# Patient Record
Sex: Female | Born: 1950 | Hispanic: No | Marital: Single | Smoking: Former smoker
Health system: Southern US, Community
[De-identification: ages and names within clinical notes are randomized; demographics above are authoritative.]

## PROBLEM LIST (undated history)

## (undated) DIAGNOSIS — E119 Type 2 diabetes mellitus without complications: Secondary | ICD-10-CM

## (undated) DIAGNOSIS — K219 Gastro-esophageal reflux disease without esophagitis: Secondary | ICD-10-CM

## (undated) DIAGNOSIS — J449 Chronic obstructive pulmonary disease, unspecified: Secondary | ICD-10-CM

## (undated) DIAGNOSIS — G709 Myoneural disorder, unspecified: Secondary | ICD-10-CM

## (undated) DIAGNOSIS — Z8701 Personal history of pneumonia (recurrent): Secondary | ICD-10-CM

## (undated) DIAGNOSIS — G4730 Sleep apnea, unspecified: Secondary | ICD-10-CM

## (undated) DIAGNOSIS — F32A Depression, unspecified: Secondary | ICD-10-CM

## (undated) DIAGNOSIS — E785 Hyperlipidemia, unspecified: Secondary | ICD-10-CM

## (undated) DIAGNOSIS — I10 Essential (primary) hypertension: Secondary | ICD-10-CM

## (undated) DIAGNOSIS — F329 Major depressive disorder, single episode, unspecified: Secondary | ICD-10-CM

## (undated) DIAGNOSIS — M1990 Unspecified osteoarthritis, unspecified site: Secondary | ICD-10-CM

## (undated) DIAGNOSIS — G510 Bell's palsy: Secondary | ICD-10-CM

## (undated) HISTORY — PX: ABDOMINAL HYSTERECTOMY: SHX81

## (undated) HISTORY — PX: COLONOSCOPY: SHX174

## (undated) HISTORY — PX: APPENDECTOMY: SHX54

## (undated) HISTORY — PX: ANKLE SURGERY: SHX546

---

## 1986-03-02 HISTORY — PX: LUMBAR LAMINECTOMY: SHX95

## 1990-03-02 HISTORY — PX: LUMBAR FUSION: SHX111

## 2010-08-25 ENCOUNTER — Other Ambulatory Visit: Payer: Self-pay | Admitting: Physical Medicine and Rehabilitation

## 2010-08-25 DIAGNOSIS — M79605 Pain in left leg: Secondary | ICD-10-CM

## 2010-08-25 DIAGNOSIS — M79604 Pain in right leg: Secondary | ICD-10-CM

## 2010-08-26 ENCOUNTER — Ambulatory Visit
Admission: RE | Admit: 2010-08-26 | Discharge: 2010-08-26 | Disposition: A | Discharge status: Home / Self Care | Payer: Medicare Other | Source: Ambulatory Visit | Attending: Physical Medicine and Rehabilitation | Admitting: Physical Medicine and Rehabilitation

## 2010-08-26 DIAGNOSIS — M79604 Pain in right leg: Secondary | ICD-10-CM

## 2010-12-11 DIAGNOSIS — G4733 Obstructive sleep apnea (adult) (pediatric): Secondary | ICD-10-CM | POA: Insufficient documentation

## 2010-12-11 DIAGNOSIS — J449 Chronic obstructive pulmonary disease, unspecified: Secondary | ICD-10-CM | POA: Insufficient documentation

## 2011-04-15 DIAGNOSIS — M1990 Unspecified osteoarthritis, unspecified site: Secondary | ICD-10-CM | POA: Insufficient documentation

## 2012-12-19 DIAGNOSIS — Z9981 Dependence on supplemental oxygen: Secondary | ICD-10-CM | POA: Insufficient documentation

## 2013-02-08 DIAGNOSIS — G90529 Complex regional pain syndrome I of unspecified lower limb: Secondary | ICD-10-CM | POA: Insufficient documentation

## 2013-10-03 ENCOUNTER — Other Ambulatory Visit: Payer: Self-pay | Admitting: Physical Medicine and Rehabilitation

## 2013-10-03 DIAGNOSIS — M545 Low back pain: Secondary | ICD-10-CM

## 2013-10-31 ENCOUNTER — Other Ambulatory Visit: Payer: Medicare Other

## 2013-11-14 ENCOUNTER — Ambulatory Visit
Admission: RE | Admit: 2013-11-14 | Discharge: 2013-11-14 | Disposition: A | Discharge status: Home / Self Care | Payer: Medicare Other | Source: Ambulatory Visit | Attending: Physical Medicine and Rehabilitation | Admitting: Physical Medicine and Rehabilitation

## 2013-11-14 DIAGNOSIS — M545 Low back pain: Secondary | ICD-10-CM

## 2013-11-14 MED ORDER — GADOBENATE DIMEGLUMINE 529 MG/ML IV SOLN
20.0000 mL | Freq: Once | INTRAVENOUS | Status: AC | PRN
  Administered 2013-11-14: 20 mL via INTRAVENOUS

## 2014-10-19 DIAGNOSIS — N611 Abscess of the breast and nipple: Secondary | ICD-10-CM | POA: Insufficient documentation

## 2015-03-19 ENCOUNTER — Other Ambulatory Visit: Payer: Self-pay | Admitting: Physical Medicine and Rehabilitation

## 2015-03-19 DIAGNOSIS — M5412 Radiculopathy, cervical region: Secondary | ICD-10-CM

## 2015-03-21 DIAGNOSIS — F321 Major depressive disorder, single episode, moderate: Secondary | ICD-10-CM | POA: Insufficient documentation

## 2015-03-24 ENCOUNTER — Ambulatory Visit
Admission: RE | Admit: 2015-03-24 | Discharge: 2015-03-24 | Disposition: A | Discharge status: Home / Self Care | Payer: Medicare Other | Source: Ambulatory Visit | Attending: Physical Medicine and Rehabilitation | Admitting: Physical Medicine and Rehabilitation

## 2015-03-24 DIAGNOSIS — M5412 Radiculopathy, cervical region: Secondary | ICD-10-CM

## 2015-04-12 ENCOUNTER — Other Ambulatory Visit: Payer: Self-pay | Admitting: Neurosurgery

## 2015-04-12 DIAGNOSIS — G8929 Other chronic pain: Secondary | ICD-10-CM

## 2015-04-12 DIAGNOSIS — M545 Low back pain, unspecified: Secondary | ICD-10-CM

## 2015-04-12 DIAGNOSIS — M542 Cervicalgia: Secondary | ICD-10-CM

## 2015-05-01 HISTORY — PX: CARPAL TUNNEL RELEASE: SHX101

## 2015-05-03 ENCOUNTER — Ambulatory Visit
Admission: RE | Admit: 2015-05-03 | Discharge: 2015-05-03 | Disposition: A | Discharge status: Home / Self Care | Payer: Medicare Other | Source: Ambulatory Visit | Attending: Neurosurgery | Admitting: Neurosurgery

## 2015-05-03 ENCOUNTER — Encounter: Payer: Self-pay | Admitting: Radiology

## 2015-05-03 DIAGNOSIS — M542 Cervicalgia: Secondary | ICD-10-CM

## 2015-05-03 DIAGNOSIS — G8929 Other chronic pain: Secondary | ICD-10-CM

## 2015-05-03 DIAGNOSIS — E785 Hyperlipidemia, unspecified: Secondary | ICD-10-CM | POA: Insufficient documentation

## 2015-05-03 DIAGNOSIS — M545 Low back pain, unspecified: Secondary | ICD-10-CM

## 2015-05-03 DIAGNOSIS — I10 Essential (primary) hypertension: Secondary | ICD-10-CM | POA: Insufficient documentation

## 2015-05-03 MED ORDER — DIAZEPAM 5 MG PO TABS
10.0000 mg | ORAL_TABLET | Freq: Once | ORAL | Status: AC
  Administered 2015-05-03: 10 mg via ORAL

## 2015-05-03 MED ORDER — MEPERIDINE HCL 100 MG/ML IJ SOLN
100.0000 mg | Freq: Once | INTRAMUSCULAR | Status: AC
  Administered 2015-05-03: 100 mg via INTRAMUSCULAR

## 2015-05-03 MED ORDER — IOHEXOL 300 MG/ML  SOLN
10.0000 mL | Freq: Once | INTRAMUSCULAR | Status: AC | PRN
  Administered 2015-05-03: 10 mL via INTRATHECAL

## 2015-05-03 MED ORDER — ONDANSETRON HCL 4 MG/2ML IJ SOLN
4.0000 mg | Freq: Once | INTRAMUSCULAR | Status: AC
  Administered 2015-05-03: 4 mg via INTRAMUSCULAR

## 2015-05-03 NOTE — Progress Notes (Signed)
Pt states she has been off Pamelor, Phentermine and Cymbalta for the past 2 days.  Discharge instructions explained to pt.

## 2015-05-03 NOTE — Discharge Instructions (Signed)
Myelogram Discharge Instructions  1. Go home and rest quietly for the next 24 hours.  It is important to lie flat for the next 24 hours.  Get up only to go to the restroom.  You may lie in the bed or on a couch on your back, your stomach, your left side or your right side.  You may have one pillow under your head.  You may have pillows between your knees while you are on your side or under your knees while you are on your back.  2. DO NOT drive today.  Recline the seat as far back as it will go, while still wearing your seat belt, on the way home.  3. You may get up to go to the bathroom as needed.  You may sit up for 10 minutes to eat.  You may resume your normal diet and medications unless otherwise indicated.  Drink lots of extra fluids today and tomorrow.  4. The incidence of headache, nausea, or vomiting is about 5% (one in 20 patients).  If you develop a headache, lie flat and drink plenty of fluids until the headache goes away.  Caffeinated beverages may be helpful.  If you develop severe nausea and vomiting or a headache that does not go away with flat bed rest, call [phone].  5. You may resume normal activities after your 24 hours of bed rest is over; however, do not exert yourself strongly or do any heavy lifting tomorrow. If when you get up you have a headache when standing, go back to bed and force fluids for another 24 hours.  6. Call your physician for a follow-up appointment.  The results of your myelogram will be sent directly to your physician by the following day.  7. If you have any questions or if complications develop after you arrive home, please call [phone].  Discharge instructions have been explained to the patient.  The patient, or the person responsible for the patient, fully understands these instructions.       May resume Cymbalta, Phentermine and Pamelor on May 04, 2015, after 9:30 am.

## 2015-07-05 ENCOUNTER — Other Ambulatory Visit: Payer: Self-pay | Admitting: Neurosurgery

## 2015-08-09 ENCOUNTER — Encounter (HOSPITAL_COMMUNITY)
Admission: RE | Admit: 2015-08-09 | Discharge: 2015-08-09 | Disposition: A | Discharge status: Home / Self Care | Payer: Medicare Other | Source: Ambulatory Visit | Attending: Neurosurgery | Admitting: Neurosurgery

## 2015-08-09 ENCOUNTER — Encounter (HOSPITAL_COMMUNITY): Payer: Self-pay

## 2015-08-09 DIAGNOSIS — Z87891 Personal history of nicotine dependence: Secondary | ICD-10-CM | POA: Insufficient documentation

## 2015-08-09 DIAGNOSIS — K219 Gastro-esophageal reflux disease without esophagitis: Secondary | ICD-10-CM | POA: Insufficient documentation

## 2015-08-09 DIAGNOSIS — E785 Hyperlipidemia, unspecified: Secondary | ICD-10-CM | POA: Insufficient documentation

## 2015-08-09 DIAGNOSIS — I10 Essential (primary) hypertension: Secondary | ICD-10-CM | POA: Diagnosis not present

## 2015-08-09 DIAGNOSIS — Z79899 Other long term (current) drug therapy: Secondary | ICD-10-CM | POA: Diagnosis not present

## 2015-08-09 DIAGNOSIS — J449 Chronic obstructive pulmonary disease, unspecified: Secondary | ICD-10-CM | POA: Diagnosis not present

## 2015-08-09 DIAGNOSIS — M4722 Other spondylosis with radiculopathy, cervical region: Secondary | ICD-10-CM | POA: Diagnosis not present

## 2015-08-09 DIAGNOSIS — G4733 Obstructive sleep apnea (adult) (pediatric): Secondary | ICD-10-CM | POA: Diagnosis not present

## 2015-08-09 DIAGNOSIS — E119 Type 2 diabetes mellitus without complications: Secondary | ICD-10-CM | POA: Diagnosis not present

## 2015-08-09 DIAGNOSIS — Z01812 Encounter for preprocedural laboratory examination: Secondary | ICD-10-CM | POA: Insufficient documentation

## 2015-08-09 DIAGNOSIS — Z01818 Encounter for other preprocedural examination: Secondary | ICD-10-CM | POA: Diagnosis present

## 2015-08-09 HISTORY — DX: Type 2 diabetes mellitus without complications: E11.9

## 2015-08-09 HISTORY — DX: Chronic obstructive pulmonary disease, unspecified: J44.9

## 2015-08-09 HISTORY — DX: Unspecified osteoarthritis, unspecified site: M19.90

## 2015-08-09 HISTORY — DX: Myoneural disorder, unspecified: G70.9

## 2015-08-09 HISTORY — DX: Sleep apnea, unspecified: G47.30

## 2015-08-09 HISTORY — DX: Bell's palsy: G51.0

## 2015-08-09 HISTORY — DX: Gastro-esophageal reflux disease without esophagitis: K21.9

## 2015-08-09 HISTORY — DX: Essential (primary) hypertension: I10

## 2015-08-09 HISTORY — DX: Hyperlipidemia, unspecified: E78.5

## 2015-08-09 HISTORY — DX: Personal history of pneumonia (recurrent): Z87.01

## 2015-08-09 LAB — CBC
HCT: 41.4 % (ref 36.0–46.0)
Hemoglobin: 13.9 g/dL (ref 12.0–15.0)
MCH: 29.8 pg (ref 26.0–34.0)
MCHC: 33.6 g/dL (ref 30.0–36.0)
MCV: 88.8 fL (ref 78.0–100.0)
PLATELETS: 271 10*3/uL (ref 150–400)
RBC: 4.66 MIL/uL (ref 3.87–5.11)
RDW: 14.7 % (ref 11.5–15.5)
WBC: 9.5 10*3/uL (ref 4.0–10.5)

## 2015-08-09 LAB — BASIC METABOLIC PANEL
Anion gap: 10 (ref 5–15)
BUN: 20 mg/dL (ref 6–20)
CALCIUM: 10.7 mg/dL — AB (ref 8.9–10.3)
CHLORIDE: 101 mmol/L (ref 101–111)
CO2: 26 mmol/L (ref 22–32)
CREATININE: 0.66 mg/dL (ref 0.44–1.00)
GFR calc Af Amer: >60 mL/min (ref >60)
GFR calc non Af Amer: >60 mL/min (ref >60)
Glucose, Bld: 132 mg/dL — ABNORMAL HIGH (ref 65–99)
Potassium: 4 mmol/L (ref 3.5–5.1)
SODIUM: 137 mmol/L (ref 135–145)

## 2015-08-09 LAB — SURGICAL PCR SCREEN
MRSA, PCR: NEGATIVE
Staphylococcus aureus: POSITIVE — AB

## 2015-08-09 LAB — GLUCOSE, CAPILLARY: Glucose-Capillary: 137 mg/dL — ABNORMAL HIGH (ref 65–99)

## 2015-08-09 NOTE — Progress Notes (Signed)
PCR negative for MRSA, positive for MSSA. Message left on patients voicemail to notify patient of result. Prescription called to pt pharmacy [facility] in [place]).

## 2015-08-09 NOTE — Pre-Procedure Instructions (Signed)
Anna Abbott  08/09/2015      [facility] [address], [place] - [address] [place] [address] Phone: [phone] Fax: [phone]    Your procedure is scheduled on Monday, August 19, 2015.   Report to [facility] Admitting at 5:30 A.M.   Call this number if you have problems the morning of surgery:  [phone]   Remember:  Do not eat food or drink liquids after midnight.   Take these medicines the morning of surgery with A SIP OF WATER: amlodipine (norvasc), duloxetine (cymbalta), hydrocodone- acetaminophen (norco) if needed, acetaminophen (tylenol) if needed   Stop taking these medicines 7 days before surgery (June 12): diflocenac (voltaren), Aspirin, NSAIDS, advil, aleve, ibuprofen, naproxen, BC's, Goody's, herbal supplements, vitamins (Calcium, Vitamin D3, vitamin B12, multivitamin, omega-3, fish oil)   Do not wear jewelry, make-up or nail polish.  Do not wear lotions, powders, or perfumes.  You may wear deodorant.  Do not shave 48 hours prior to surgery.  Men may shave face and neck.  Do not bring valuables to the hospital.  [facility] is not responsible for any belongings or valuables.  Contacts, dentures or bridgework may not be worn into surgery.  Leave your suitcase in the car.  After surgery it may be brought to your room.  For patients admitted to the hospital, discharge time will be determined by your treatment team.  Patients discharged the day of surgery will not be allowed to drive home.     How to Manage Your Diabetes Before and After Surgery  Why is it important to control my blood sugar before and after surgery? . Improving blood sugar levels before and after surgery helps healing and can limit problems. . A way of improving blood sugar control is eating a healthy diet by: o  Eating less sugar and carbohydrates o  Increasing activity/exercise o  Talking with your doctor about reaching your blood sugar  goals . High blood sugars (greater than 180 mg/dL) can raise your risk of infections and slow your recovery, so you will need to focus on controlling your diabetes during the weeks before surgery. . Make sure that the doctor who takes care of your diabetes knows about your planned surgery including the date and location.  How do I manage my blood sugar before surgery? . Check your blood sugar at least 4 times a day, starting 2 days before surgery, to make sure that the level is not too high or low. o Check your blood sugar the morning of your surgery when you wake up and every 2 hours until you get to the Short Stay unit. . If your blood sugar is less than 70 mg/dL, you will need to treat for low blood sugar: o Do not take insulin. o Treat a low blood sugar (less than 70 mg/dL) with  cup of clear juice (cranberry or apple), 4 glucose tablets, OR glucose gel. o Recheck blood sugar in 15 minutes after treatment (to make sure it is greater than 70 mg/dL). If your blood sugar is not greater than 70 mg/dL on recheck, call [phone] for further instructions. . Report your blood sugar to the short stay nurse when you get to Short Stay.  . If you are admitted to the hospital after surgery: o Your blood sugar will be checked by the staff and you will probably be given insulin after surgery (instead of oral diabetes medicines) to make sure you have good  blood sugar levels. o The goal for blood sugar control after surgery is 80-180 mg/dL.   Please read over the following fact sheets that you were given. MRSA Information

## 2015-08-09 NOTE — Progress Notes (Signed)
PCP: Eligha BridegroomSteven Spivey, MD No cardiologist, pt reports possible echo or stress test years ago.   Sleep study 1 year ago per pt- sleep study requested from Prudencio BurlySheila Small-stokes, MD, pt wears CPAP at night with 2LO2, pt states she can go 3-4 days without CPAP machine. Pt instructed to bring nasal pillow to hospital day of surgery.   Pt Diabetic, last Hgb A1c 08/28/14 6.9, fasting CBGs in 130s per patient, pt instructed that if CBG in 200s prior to surgery to contact PCP as her case may be cancelled if her blood sugar is over 200 on the day of surgery.   Pt with no complaints of cough, fever, chest pain, or shortness of breath.

## 2015-08-10 LAB — HEMOGLOBIN A1C
HEMOGLOBIN A1C: 7 % — AB (ref 4.8–5.6)
MEAN PLASMA GLUCOSE: 154 mg/dL

## 2015-08-12 NOTE — Progress Notes (Addendum)
Anesthesia Chart Review:  Pt is a 65 year old female scheduled for C5-6, C6-7 ACDF on 08/19/2015 with Dr. Lovell SheehanJenkins.   PCP is Dr. Eligha BridegroomSteven Spivey (care everywhere)  PMH includes:  HTN, DM, OSA (on CPAP), COPD, hyperlipidemia, GERD. Former smoker. BMI 42  Medications include: amlodipine, hctz, lisinopril, pravastatin.   Preoperative labs reviewed.  HgbA1c 7.0, glucose 132  EKG 08/09/15: NSR. Cannot rule out Inferior infarct, age undetermined. Artifact  I spoke with pt by telephone. Pt denies any CV symptoms. However, she reports she is extremely sedentary due to pain.  Reviewed case with Dr. Chaney MallingHodierne. Given EKG results and physical inactivity, pt will need medical clearance for surgery. Notified Erie NoeVanessa in Dr. Lovell SheehanJenkins' office.   Rica Mastngela Kristen Bushway, FNP-BC [facility] Phone: [phone] 08/13/2015 2:15 PM  Addendum:  Pt saw Linton RumpBrittany Everett, FNP [facility]/15/15. Pt was sent to cardiology for evaluation. Pt saw Theresia Boughhomas Freeman, [place] at [facility] 08/16/15 and was cleared for surgery with no further testing based on lack of CV sx and EKG findings.   If no changes, I anticipate pt can proceed with surgery as scheduled.   Rica Mastngela Yahsir Wickens, FNP-BC [facility] Phone: [phone] 08/16/2015 12:51 PM

## 2015-08-19 ENCOUNTER — Ambulatory Visit (HOSPITAL_COMMUNITY): Payer: Medicare Other

## 2015-08-19 ENCOUNTER — Ambulatory Visit (HOSPITAL_COMMUNITY): Payer: Medicare Other | Admitting: Emergency Medicine

## 2015-08-19 ENCOUNTER — Ambulatory Visit (HOSPITAL_COMMUNITY)
Admission: RE | Admit: 2015-08-19 | Discharge: 2015-08-20 | Disposition: A | Discharge status: Home / Self Care | Payer: Medicare Other | Source: Ambulatory Visit | Attending: Neurosurgery | Admitting: Neurosurgery

## 2015-08-19 ENCOUNTER — Encounter (HOSPITAL_COMMUNITY): Payer: Self-pay | Admitting: Surgery

## 2015-08-19 ENCOUNTER — Ambulatory Visit (HOSPITAL_COMMUNITY): Payer: Medicare Other | Admitting: Anesthesiology

## 2015-08-19 ENCOUNTER — Encounter (HOSPITAL_COMMUNITY)
Admission: RE | Disposition: A | Discharge status: Home / Self Care | Payer: Self-pay | Source: Ambulatory Visit | Attending: Neurosurgery

## 2015-08-19 DIAGNOSIS — Z79891 Long term (current) use of opiate analgesic: Secondary | ICD-10-CM | POA: Diagnosis not present

## 2015-08-19 DIAGNOSIS — M4802 Spinal stenosis, cervical region: Secondary | ICD-10-CM | POA: Diagnosis not present

## 2015-08-19 DIAGNOSIS — G4730 Sleep apnea, unspecified: Secondary | ICD-10-CM | POA: Insufficient documentation

## 2015-08-19 DIAGNOSIS — E785 Hyperlipidemia, unspecified: Secondary | ICD-10-CM | POA: Insufficient documentation

## 2015-08-19 DIAGNOSIS — J449 Chronic obstructive pulmonary disease, unspecified: Secondary | ICD-10-CM | POA: Diagnosis not present

## 2015-08-19 DIAGNOSIS — M50122 Cervical disc disorder at C5-C6 level with radiculopathy: Secondary | ICD-10-CM | POA: Insufficient documentation

## 2015-08-19 DIAGNOSIS — Z6841 Body Mass Index (BMI) 40.0 and over, adult: Secondary | ICD-10-CM | POA: Diagnosis not present

## 2015-08-19 DIAGNOSIS — E119 Type 2 diabetes mellitus without complications: Secondary | ICD-10-CM | POA: Insufficient documentation

## 2015-08-19 DIAGNOSIS — Z79899 Other long term (current) drug therapy: Secondary | ICD-10-CM | POA: Diagnosis not present

## 2015-08-19 DIAGNOSIS — Z87891 Personal history of nicotine dependence: Secondary | ICD-10-CM | POA: Diagnosis not present

## 2015-08-19 DIAGNOSIS — Z419 Encounter for procedure for purposes other than remedying health state, unspecified: Secondary | ICD-10-CM

## 2015-08-19 DIAGNOSIS — M4722 Other spondylosis with radiculopathy, cervical region: Secondary | ICD-10-CM | POA: Insufficient documentation

## 2015-08-19 DIAGNOSIS — I10 Essential (primary) hypertension: Secondary | ICD-10-CM | POA: Diagnosis not present

## 2015-08-19 HISTORY — PX: ANTERIOR CERVICAL DECOMP/DISCECTOMY FUSION: SHX1161

## 2015-08-19 LAB — GLUCOSE, CAPILLARY
Glucose-Capillary: 135 mg/dL — ABNORMAL HIGH (ref 65–99)
Glucose-Capillary: 151 mg/dL — ABNORMAL HIGH (ref 65–99)
Glucose-Capillary: 121 mg/dL — ABNORMAL HIGH (ref 65–99)
Glucose-Capillary: 169 mg/dL — ABNORMAL HIGH (ref 65–99)

## 2015-08-19 SURGERY — ANTERIOR CERVICAL DECOMPRESSION/DISCECTOMY FUSION 2 LEVELS
Anesthesia: General | Site: Neck

## 2015-08-19 MED ORDER — MIDAZOLAM HCL 5 MG/5ML IJ SOLN
INTRAMUSCULAR | Status: DC | PRN
  Administered 2015-08-19: 2 mg via INTRAVENOUS

## 2015-08-19 MED ORDER — LACTATED RINGERS IV SOLN: INTRAVENOUS | Status: DC

## 2015-08-19 MED ORDER — PHENYLEPHRINE HCL 10 MG/ML IJ SOLN
INTRAMUSCULAR | Status: DC | PRN
  Administered 2015-08-19 (×2): 80 ug via INTRAVENOUS
  Administered 2015-08-19: 120 ug via INTRAVENOUS

## 2015-08-19 MED ORDER — FENTANYL CITRATE (PF) 100 MCG/2ML IJ SOLN
INTRAMUSCULAR | Status: DC | PRN
  Administered 2015-08-19 (×3): 50 ug via INTRAVENOUS
  Administered 2015-08-19: 200 ug via INTRAVENOUS
  Administered 2015-08-19: 50 ug via INTRAVENOUS

## 2015-08-19 MED ORDER — PHENYLEPHRINE HCL 10 MG/ML IJ SOLN
10.0000 mg | INTRAVENOUS | Status: DC | PRN
  Administered 2015-08-19: 20 ug/min via INTRAVENOUS

## 2015-08-19 MED ORDER — LISINOPRIL 20 MG PO TABS
40.0000 mg | ORAL_TABLET | Freq: Every day | ORAL | Status: DC
  Administered 2015-08-19 – 2015-08-20 (×2): 40 mg via ORAL
  Filled 2015-08-19 (×2): qty 2

## 2015-08-19 MED ORDER — BUPIVACAINE-EPINEPHRINE (PF) 0.5% -1:200000 IJ SOLN
INTRAMUSCULAR | Status: DC | PRN
  Administered 2015-08-19: 10 mL

## 2015-08-19 MED ORDER — PROPOFOL 10 MG/ML IV BOLUS
INTRAVENOUS | Status: AC
  Filled 2015-08-19: qty 20

## 2015-08-19 MED ORDER — BACITRACIN ZINC 500 UNIT/GM EX OINT
TOPICAL_OINTMENT | CUTANEOUS | Status: DC | PRN
  Administered 2015-08-19: 1 via TOPICAL

## 2015-08-19 MED ORDER — CEFAZOLIN SODIUM-DEXTROSE 2-4 GM/100ML-% IV SOLN
2.0000 g | Freq: Three times a day (TID) | INTRAVENOUS | Status: AC
  Administered 2015-08-19 – 2015-08-20 (×2): 2 g via INTRAVENOUS
  Filled 2015-08-19 (×2): qty 100

## 2015-08-19 MED ORDER — OXYCODONE-ACETAMINOPHEN 5-325 MG PO TABS
1.0000 | ORAL_TABLET | ORAL | Status: DC | PRN
  Administered 2015-08-19: 2 via ORAL
  Filled 2015-08-19: qty 2

## 2015-08-19 MED ORDER — PHENOL 1.4 % MT LIQD
1.0000 | OROMUCOSAL | Status: DC | PRN
Start: 2015-08-19 — End: 2015-08-20
  Administered 2015-08-19: 1 via OROMUCOSAL
  Filled 2015-08-19: qty 177

## 2015-08-19 MED ORDER — ROCURONIUM BROMIDE 50 MG/5ML IV SOLN
INTRAVENOUS | Status: AC
  Filled 2015-08-19: qty 1

## 2015-08-19 MED ORDER — 0.9 % SODIUM CHLORIDE (POUR BTL) OPTIME
TOPICAL | Status: DC | PRN
  Administered 2015-08-19: 1000 mL

## 2015-08-19 MED ORDER — DIAZEPAM 5 MG PO TABS
5.0000 mg | ORAL_TABLET | Freq: Four times a day (QID) | ORAL | Status: DC | PRN
  Administered 2015-08-19 – 2015-08-20 (×2): 5 mg via ORAL
  Filled 2015-08-19 (×2): qty 1

## 2015-08-19 MED ORDER — DEXAMETHASONE 4 MG PO TABS
4.0000 mg | ORAL_TABLET | Freq: Four times a day (QID) | ORAL | Status: AC
  Administered 2015-08-19 – 2015-08-20 (×2): 4 mg via ORAL
  Filled 2015-08-19 (×2): qty 1

## 2015-08-19 MED ORDER — MENTHOL 3 MG MT LOZG: 1.0000 | LOZENGE | OROMUCOSAL | Status: DC | PRN

## 2015-08-19 MED ORDER — DULOXETINE HCL 30 MG PO CPEP
120.0000 mg | ORAL_CAPSULE | ORAL | Status: DC
  Administered 2015-08-20: 120 mg via ORAL
  Filled 2015-08-19: qty 4

## 2015-08-19 MED ORDER — EPHEDRINE SULFATE 50 MG/ML IJ SOLN
INTRAMUSCULAR | Status: DC | PRN
  Administered 2015-08-19 (×3): 10 mg via INTRAVENOUS

## 2015-08-19 MED ORDER — ONDANSETRON HCL 4 MG/2ML IJ SOLN: 4.0000 mg | INTRAMUSCULAR | Status: DC | PRN

## 2015-08-19 MED ORDER — ACETAMINOPHEN 325 MG PO TABS: 650.0000 mg | ORAL_TABLET | ORAL | Status: DC | PRN

## 2015-08-19 MED ORDER — ROCURONIUM BROMIDE 100 MG/10ML IV SOLN
INTRAVENOUS | Status: DC | PRN
  Administered 2015-08-19: 50 mg via INTRAVENOUS

## 2015-08-19 MED ORDER — MORPHINE SULFATE (PF) 2 MG/ML IV SOLN
1.0000 mg | INTRAVENOUS | Status: DC | PRN
  Administered 2015-08-19: 4 mg via INTRAVENOUS
  Filled 2015-08-19: qty 2

## 2015-08-19 MED ORDER — PANTOPRAZOLE SODIUM 40 MG PO TBEC
40.0000 mg | DELAYED_RELEASE_TABLET | Freq: Every day | ORAL | Status: DC
  Administered 2015-08-19 – 2015-08-20 (×2): 40 mg via ORAL
  Filled 2015-08-19 (×2): qty 1

## 2015-08-19 MED ORDER — THROMBIN 5000 UNITS EX SOLR
OROMUCOSAL | Status: DC | PRN
  Administered 2015-08-19: 13:00:00 via TOPICAL

## 2015-08-19 MED ORDER — DOCUSATE SODIUM 100 MG PO CAPS
100.0000 mg | ORAL_CAPSULE | Freq: Two times a day (BID) | ORAL | Status: DC
  Administered 2015-08-19 – 2015-08-20 (×2): 100 mg via ORAL
  Filled 2015-08-19 (×2): qty 1

## 2015-08-19 MED ORDER — SODIUM CHLORIDE 0.9 % IR SOLN
Status: DC | PRN
  Administered 2015-08-19: 13:00:00

## 2015-08-19 MED ORDER — PRAVASTATIN SODIUM 40 MG PO TABS
40.0000 mg | ORAL_TABLET | Freq: Every day | ORAL | Status: DC
  Administered 2015-08-19: 40 mg via ORAL
  Filled 2015-08-19: qty 1

## 2015-08-19 MED ORDER — AMLODIPINE BESYLATE 10 MG PO TABS
10.0000 mg | ORAL_TABLET | Freq: Every day | ORAL | Status: DC
  Administered 2015-08-20: 10 mg via ORAL
  Filled 2015-08-19: qty 1

## 2015-08-19 MED ORDER — ACETAMINOPHEN 650 MG RE SUPP: 650.0000 mg | RECTAL | Status: DC | PRN

## 2015-08-19 MED ORDER — ALUM & MAG HYDROXIDE-SIMETH 200-200-20 MG/5ML PO SUSP: 30.0000 mL | Freq: Four times a day (QID) | ORAL | Status: DC | PRN

## 2015-08-19 MED ORDER — DEXAMETHASONE SODIUM PHOSPHATE 4 MG/ML IJ SOLN
4.0000 mg | Freq: Four times a day (QID) | INTRAMUSCULAR | Status: AC
  Administered 2015-08-20: 4 mg via INTRAVENOUS
  Filled 2015-08-19: qty 1

## 2015-08-19 MED ORDER — SUGAMMADEX SODIUM 200 MG/2ML IV SOLN
INTRAVENOUS | Status: DC | PRN
  Administered 2015-08-19: 230 mg via INTRAVENOUS

## 2015-08-19 MED ORDER — PROPOFOL 10 MG/ML IV BOLUS
INTRAVENOUS | Status: DC | PRN
  Administered 2015-08-19: 200 mg via INTRAVENOUS

## 2015-08-19 MED ORDER — FENTANYL CITRATE (PF) 100 MCG/2ML IJ SOLN: 25.0000 ug | INTRAMUSCULAR | Status: DC | PRN

## 2015-08-19 MED ORDER — MIDAZOLAM HCL 2 MG/2ML IJ SOLN
INTRAMUSCULAR | Status: AC
  Filled 2015-08-19: qty 2

## 2015-08-19 MED ORDER — HYDROCHLOROTHIAZIDE 25 MG PO TABS
25.0000 mg | ORAL_TABLET | Freq: Every day | ORAL | Status: DC
  Administered 2015-08-19 – 2015-08-20 (×2): 25 mg via ORAL
  Filled 2015-08-19 (×2): qty 1

## 2015-08-19 MED ORDER — HYDROCODONE-ACETAMINOPHEN 5-325 MG PO TABS
1.0000 | ORAL_TABLET | ORAL | Status: DC | PRN
  Administered 2015-08-19 – 2015-08-20 (×3): 2 via ORAL
  Filled 2015-08-19 (×3): qty 2

## 2015-08-19 MED ORDER — THROMBIN 5000 UNITS EX SOLR
CUTANEOUS | Status: DC | PRN
  Administered 2015-08-19 (×2): 5000 [IU] via TOPICAL

## 2015-08-19 MED ORDER — FENTANYL CITRATE (PF) 250 MCG/5ML IJ SOLN
INTRAMUSCULAR | Status: AC
  Filled 2015-08-19: qty 5

## 2015-08-19 MED ORDER — CEFAZOLIN SODIUM-DEXTROSE 2-4 GM/100ML-% IV SOLN
2.0000 g | INTRAVENOUS | Status: AC
  Administered 2015-08-19: 2 g via INTRAVENOUS
  Filled 2015-08-19: qty 100

## 2015-08-19 MED ORDER — HEMOSTATIC AGENTS (NO CHARGE) OPTIME
TOPICAL | Status: DC | PRN
  Administered 2015-08-19: 1 via TOPICAL

## 2015-08-19 MED ORDER — BISACODYL 10 MG RE SUPP: 10.0000 mg | Freq: Every day | RECTAL | Status: DC | PRN

## 2015-08-19 MED ORDER — LACTATED RINGERS IV SOLN
INTRAVENOUS | Status: DC
  Administered 2015-08-19 (×2): via INTRAVENOUS

## 2015-08-19 MED ORDER — NORTRIPTYLINE HCL 25 MG PO CAPS
100.0000 mg | ORAL_CAPSULE | Freq: Every day | ORAL | Status: DC
  Administered 2015-08-19: 100 mg via ORAL
  Filled 2015-08-19: qty 4

## 2015-08-19 MED ORDER — LIDOCAINE HCL (CARDIAC) 20 MG/ML IV SOLN
INTRAVENOUS | Status: DC | PRN
  Administered 2015-08-19: 100 mg via INTRAVENOUS

## 2015-08-19 MED ORDER — ADULT MULTIVITAMIN W/MINERALS CH
1.0000 | ORAL_TABLET | Freq: Every day | ORAL | Status: DC
  Administered 2015-08-20: 1 via ORAL
  Filled 2015-08-19 (×2): qty 1

## 2015-08-19 SURGICAL SUPPLY — 74 items
BAG DECANTER FOR FLEXI CONT (MISCELLANEOUS) ×3 IMPLANT
BENZOIN TINCTURE PRP APPL 2/3 (GAUZE/BANDAGES/DRESSINGS) ×3 IMPLANT
BIT DRILL NEURO 2X3.1 SFT TUCH (MISCELLANEOUS) ×1 IMPLANT
BLADE SURG 15 STRL LF DISP TIS (BLADE) ×1 IMPLANT
BLADE SURG 15 STRL SS (BLADE) ×2
BLADE ULTRA TIP 2M (BLADE) ×3 IMPLANT
BRUSH SCRUB EZ PLAIN DRY (MISCELLANEOUS) ×3 IMPLANT
BUR BARREL STRAIGHT FLUTE 4.0 (BURR) ×3 IMPLANT
BUR MATCHSTICK NEURO 3.0 LAGG (BURR) ×3 IMPLANT
CANISTER SUCT 3000ML PPV (MISCELLANEOUS) ×3 IMPLANT
CLOSURE WOUND 1/2 X4 (GAUZE/BANDAGES/DRESSINGS) ×1
COVER MAYO STAND STRL (DRAPES) ×3 IMPLANT
DEVICE FUSION VISTA 11X14X8MM (Spacer) ×1 IMPLANT
DRAPE LAPAROTOMY 100X72 PEDS (DRAPES) ×3 IMPLANT
DRAPE MICROSCOPE LEICA (MISCELLANEOUS) IMPLANT
DRAPE POUCH INSTRU U-SHP 10X18 (DRAPES) ×3 IMPLANT
DRAPE SURG 17X23 STRL (DRAPES) ×6 IMPLANT
DRILL NEURO 2X3.1 SOFT TOUCH (MISCELLANEOUS) ×3
ELECT BLADE 4.0 EZ CLEAN MEGAD (MISCELLANEOUS) ×3
ELECT REM PT RETURN 9FT ADLT (ELECTROSURGICAL) ×3
ELECTRODE BLDE 4.0 EZ CLN MEGD (MISCELLANEOUS) ×1 IMPLANT
ELECTRODE REM PT RTRN 9FT ADLT (ELECTROSURGICAL) ×1 IMPLANT
GAUZE SPONGE 4X4 12PLY STRL (GAUZE/BANDAGES/DRESSINGS) ×3 IMPLANT
GAUZE SPONGE 4X4 16PLY XRAY LF (GAUZE/BANDAGES/DRESSINGS) IMPLANT
GLOVE BIO SURGEON STRL SZ8 (GLOVE) ×6 IMPLANT
GLOVE BIO SURGEON STRL SZ8.5 (GLOVE) ×3 IMPLANT
GLOVE BIOGEL PI IND STRL 6.5 (GLOVE) ×2 IMPLANT
GLOVE BIOGEL PI IND STRL 7.0 (GLOVE) ×1 IMPLANT
GLOVE BIOGEL PI IND STRL 7.5 (GLOVE) ×1 IMPLANT
GLOVE BIOGEL PI IND STRL 8 (GLOVE) ×5 IMPLANT
GLOVE BIOGEL PI INDICATOR 6.5 (GLOVE) ×4
GLOVE BIOGEL PI INDICATOR 7.0 (GLOVE) ×2
GLOVE BIOGEL PI INDICATOR 7.5 (GLOVE) ×2
GLOVE BIOGEL PI INDICATOR 8 (GLOVE) ×10
GLOVE ECLIPSE 7.5 STRL STRAW (GLOVE) ×9 IMPLANT
GLOVE EXAM NITRILE LRG STRL (GLOVE) IMPLANT
GLOVE EXAM NITRILE MD LF STRL (GLOVE) IMPLANT
GLOVE EXAM NITRILE XL STR (GLOVE) IMPLANT
GLOVE EXAM NITRILE XS STR PU (GLOVE) IMPLANT
GLOVE INDICATOR 8.5 STRL (GLOVE) ×3 IMPLANT
GLOVE SURG SS PI 6.5 STRL IVOR (GLOVE) ×9 IMPLANT
GLOVE SURG SS PI 7.0 STRL IVOR (GLOVE) ×3 IMPLANT
GOWN STRL REUS W/ TWL LRG LVL3 (GOWN DISPOSABLE) ×1 IMPLANT
GOWN STRL REUS W/ TWL XL LVL3 (GOWN DISPOSABLE) ×3 IMPLANT
GOWN STRL REUS W/TWL 2XL LVL3 (GOWN DISPOSABLE) ×6 IMPLANT
GOWN STRL REUS W/TWL LRG LVL3 (GOWN DISPOSABLE) ×2
GOWN STRL REUS W/TWL XL LVL3 (GOWN DISPOSABLE) ×6
HEMOSTAT POWDER KIT SURGIFOAM (HEMOSTASIS) ×3 IMPLANT
KIT BASIN OR (CUSTOM PROCEDURE TRAY) ×3 IMPLANT
KIT ROOM TURNOVER OR (KITS) ×3 IMPLANT
MARKER SKIN DUAL TIP RULER LAB (MISCELLANEOUS) ×6 IMPLANT
NEEDLE HYPO 22GX1.5 SAFETY (NEEDLE) ×3 IMPLANT
NEEDLE SPNL 18GX3.5 QUINCKE PK (NEEDLE) ×3 IMPLANT
NS IRRIG 1000ML POUR BTL (IV SOLUTION) ×3 IMPLANT
PACK LAMINECTOMY NEURO (CUSTOM PROCEDURE TRAY) ×3 IMPLANT
PATTIES SURGICAL 1X1 (DISPOSABLE) ×3 IMPLANT
PEEK S VISTA 7X11X14 (Peek) ×3 IMPLANT
PIN DISTRACTION 14MM (PIN) ×6 IMPLANT
PLATE ANT CERV XTEND 2 LV 28 (Plate) ×3 IMPLANT
PUTTY KINEX BIOACTIVE 2CC (Bone Implant) ×3 IMPLANT
PUTTY KINEX BIOACTIVE 5CC (Bone Implant) ×3 IMPLANT
RUBBERBAND STERILE (MISCELLANEOUS) IMPLANT
SCREW XTD VAR 4.2 SELF TAP 12 (Screw) ×18 IMPLANT
SPONGE INTESTINAL PEANUT (DISPOSABLE) ×9 IMPLANT
SPONGE SURGIFOAM ABS GEL SZ50 (HEMOSTASIS) ×3 IMPLANT
STRIP CLOSURE SKIN 1/2X4 (GAUZE/BANDAGES/DRESSINGS) ×2 IMPLANT
SUT VIC AB 0 CT1 27 (SUTURE) ×2
SUT VIC AB 0 CT1 27XBRD ANTBC (SUTURE) ×1 IMPLANT
SUT VIC AB 3-0 SH 8-18 (SUTURE) ×6 IMPLANT
TAPE CLOTH SURG 4X10 WHT LF (GAUZE/BANDAGES/DRESSINGS) ×3 IMPLANT
TOWEL OR 17X24 6PK STRL BLUE (TOWEL DISPOSABLE) ×3 IMPLANT
TOWEL OR 17X26 10 PK STRL BLUE (TOWEL DISPOSABLE) ×3 IMPLANT
VISTA 11X14X8MM (Spacer) ×3 IMPLANT
WATER STERILE IRR 1000ML POUR (IV SOLUTION) ×3 IMPLANT

## 2015-08-19 NOTE — Progress Notes (Signed)
   08/19/15 1015  Clinical Encounter Type  Visited With Patient;Other (Comment)  Visit Type Initial;Other (Comment) (Adv Directive)  Referral From Patient  Spiritual Encounters  Spiritual Needs Literature;Emotional;Other (Comment) (Administratoracilitated Advanced Directive)  Advance Directives (For Healthcare)  Does patient have an advance directive? Yes  Type of Estate agentAdvance Directive Healthcare Power of WeemsAttorney;Living will  Copy of advanced directive(s) in chart? Yes (Placed on chart)  CH requested to review and facilitate HCPOA document; CH arranged for witnesses and notary to completed HCPOA/AD. 10:58 AM Erline LevineMichael I Calista Crain

## 2015-08-19 NOTE — Anesthesia Procedure Notes (Signed)
Procedure Name: Intubation Date/Time: 08/19/2015 11:59 AM Performed by: Rosiland OzMEYERS, Tanav Orsak Pre-anesthesia Checklist: Patient identified, Timeout performed, Emergency Drugs available, Patient being monitored and Suction available Patient Re-evaluated:Patient Re-evaluated prior to inductionOxygen Delivery Method: Circle system utilized Preoxygenation: Pre-oxygenation with 100% oxygen Intubation Type: IV induction Ventilation: Mask ventilation without difficulty and Nasal airway inserted- appropriate to patient size Laryngoscope Size: Hyacinth MeekerMiller and 2 Grade View: Grade I Tube type: Oral Tube size: 7.0 mm Number of attempts: 1 Airway Equipment and Method: Stylet Placement Confirmation: ETT inserted through vocal cords under direct vision,  breath sounds checked- equal and bilateral and positive ETCO2 Secured at: 21 cm Tube secured with: Tape Dental Injury: Teeth and Oropharynx as per pre-operative assessment

## 2015-08-19 NOTE — H&P (Signed)
Subjective: The patient is a 65 year old white female who has complained of neck and arm pain and numbness tingling and weakness. She has failed medical management and was worked up with a cervical MRI. This demonstrated spondylosis and stenosis at C5-6 and C6-7. I discussed the situation with the patient. We discussed the various treatment options. She has decided to proceed with surgery.   Past Medical History  Diagnosis Date  . Hypertension   . Sleep apnea     wears CPAP with nasal pillow and 2L oxygen  . COPD (chronic obstructive pulmonary disease) (HCC)     pt states diagnosed years ago but no problems  . History of pneumonia   . Diabetes mellitus without complication (HCC)     type II  . GERD (gastroesophageal reflux disease)     every now and then with eating, no medicine  . Neuromuscular disorder (HCC)   . Arthritis   . Hyperlipidemia   . Bell's palsy     "30 years ago, small case of it"    Past Surgical History  Procedure Laterality Date  . Abdominal hysterectomy    . Lumbar laminectomy  1988  . Lumbar fusion  1992  . Ankle surgery Left   . Carpal tunnel release Left march 2017  . Colonoscopy    . Appendectomy      Allergies  Allergen Reactions  . Niacin Other (See Comments)    flushing    Social History  Substance Use Topics  . Smoking status: Former Smoker -- 2.00 packs/day for 35 years    Types: Cigarettes  . Smokeless tobacco: Former NeurosurgeonUser    Quit date: 05/03/2006  . Alcohol Use: No    History reviewed. No pertinent family history. Prior to Admission medications   Medication Sig Start Date End Date Taking? Authorizing Provider  acetaminophen (RA ACETAMINOPHEN) 650 MG CR tablet Take 1,300 mg by mouth every 8 (eight) hours.    Yes Historical Provider, MD  amLODipine (NORVASC) 10 MG tablet Take 10 mg by mouth daily.  02/15/15  Yes Historical Provider, MD  calcium carbonate (CALCIUM 600) 600 MG TABS tablet Take 600 mg by mouth at bedtime.    Yes Historical  Provider, MD  Cholecalciferol (HM VITAMIN D3) 4000 units CAPS Take 1 capsule by mouth daily.   Yes Historical Provider, MD  Cyanocobalamin (B-12) 2000 MCG TABS Take 1 tablet by mouth daily.   Yes Historical Provider, MD  diclofenac sodium (VOLTAREN) 1 % GEL Apply 1 application topically as needed.   Yes Historical Provider, MD  DULoxetine (CYMBALTA) 60 MG capsule Take 120 mg by mouth every morning.  10/14/14  Yes Historical Provider, MD  hydrochlorothiazide (HYDRODIURIL) 25 MG tablet Take 25 mg by mouth daily.  01/02/15  Yes Historical Provider, MD  HYDROcodone-acetaminophen (NORCO) 7.5-325 MG tablet Take 1 tablet by mouth 3 (three) times daily.  07/15/12  Yes Historical Provider, MD  lisinopril (PRINIVIL,ZESTRIL) 40 MG tablet Take 40 mg by mouth daily.  12/24/14  Yes Historical Provider, MD  Multiple Vitamins-Minerals (MULTIVITAMIN WITH MINERALS) tablet Take 1 tablet by mouth daily.    Yes Historical Provider, MD  nortriptyline (PAMELOR) 50 MG capsule Take 100 mg by mouth at bedtime.   Yes Historical Provider, MD  Omega-3 1000 MG CAPS Take 3,000 mg by mouth daily.  08/28/14  Yes Historical Provider, MD  pravastatin (PRAVACHOL) 40 MG tablet Take 40 mg by mouth at bedtime.   Yes Historical Provider, MD     Review of Systems  Positive ROS: As above  All other systems have been reviewed and were otherwise negative with the exception of those mentioned in the HPI and as above.  Objective: Vital signs in last 24 hours: Temp:  [98.1 F (36.7 C)] 98.1 F (36.7 C) (06/19 0813) Pulse Rate:  [94] 94 (06/19 0813) Resp:  [20] 20 (06/19 0813) BP: (168)/(58) 168/58 mmHg (06/19 0813) SpO2:  [97 %] 97 % (06/19 0813) Weight:  [112.492 kg (248 lb)] 112.492 kg (248 lb) (06/19 0813)  General Appearance: Alert, cooperative, no distress, Head: Normocephalic, without obvious abnormality, atraumatic Eyes: PERRL, conjunctiva/corneas clear, EOM's intact,    Ears: Normal  Throat: Normal  Neck: Supple,  symmetrical, trachea midline, no adenopathy; thyroid: No enlargement/tenderness/nodules; no carotid bruit or JVD Back: Symmetric, no curvature, ROM normal, no CVA tenderness Lungs: Clear to auscultation bilaterally, respirations unlabored Heart: Regular rate and rhythm, no murmur, rub or gallop Abdomen: Soft, non-tender,, no masses, no organomegaly Extremities: Extremities normal, atraumatic, no cyanosis or edema Pulses: 2+ and symmetric all extremities Skin: Skin color, texture, turgor normal, no rashes or lesions  NEUROLOGIC:   Mental status: alert and oriented, no aphasia, good attention span, Fund of knowledge/ memory ok Motor Exam - grossly normal Sensory Exam - grossly normal Reflexes:  Coordination - grossly normal Gait - grossly normal Balance - grossly normal Cranial Nerves: I: smell Not tested  II: visual acuity  OS: Normal  OD: Normal   II: visual fields Full to confrontation  II: pupils Equal, round, reactive to light  III,VII: ptosis None  III,IV,VI: extraocular muscles  Full ROM  V: mastication Normal  V: facial light touch sensation  Normal  V,VII: corneal reflex  Present  VII: facial muscle function - upper  Normal  VII: facial muscle function - lower Normal  VIII: hearing Not tested  IX: soft palate elevation  Normal  IX,X: gag reflex Present  XI: trapezius strength  5/5  XI: sternocleidomastoid strength 5/5  XI: neck flexion strength  5/5  XII: tongue strength  Normal    Data Review Lab Results  Component Value Date   WBC 9.5 08/09/2015   HGB 13.9 08/09/2015   HCT 41.4 08/09/2015   MCV 88.8 08/09/2015   PLT 271 08/09/2015   Lab Results  Component Value Date   NA 137 08/09/2015   K 4.0 08/09/2015   CL 101 08/09/2015   CO2 26 08/09/2015   BUN 20 08/09/2015   CREATININE 0.66 08/09/2015   GLUCOSE 132* 08/09/2015   No results found for: INR, PROTIME  Assessment/Plan: 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 I have reviewed her imaging studies with her and pointed out the abnormalities. We have discussed the various treatment options including surgery. I have described the surgical treatment option would C5-6 and C6-7 anterior cervical discectomy, fusion, and plating. I have shown her surgical models. We have discussed the risks, benefits, alternatives, and likelihood of achieving her goals with surgery. I have answered all the patient's questions. She has decided to proceed with surgery.   Cali Cuartas D 08/19/2015 11:40 AM

## 2015-08-19 NOTE — Progress Notes (Signed)
Pts teeth and personal belonging bag given back to pt

## 2015-08-19 NOTE — Transfer of Care (Signed)
Immediate Anesthesia Transfer of Care Note  Patient: Anna Abbott  Procedure(s) Performed: Procedure(s): Cervical five-six, Cervical six-seven Anterior cervical decompression/diskectomy/fusion/interbody prosthesis/plate (N/A)  Patient Location: PACU  Anesthesia Type:General  Level of Consciousness: awake, alert  and patient cooperative  Airway & Oxygen Therapy: Patient Spontanous Breathing and Patient connected to face mask oxygen  Post-op Assessment: Report given to RN, Post -op Vital signs reviewed and stable and Patient moving all extremities X 4  Post vital signs: Reviewed and stable  Last Vitals:  Filed Vitals:   08/19/15 0813  BP: 168/58  Pulse: 94  Temp: 36.7 C  Resp: 20    Last Pain:  Filed Vitals:   08/19/15 1455  PainSc: 5          Complications: No apparent anesthesia complications

## 2015-08-19 NOTE — Progress Notes (Signed)
Patient ID: Anna MiuLaQwiea Abbott, female   DOB: [birth year], 65 y.o.   MRN: [id number] Subjective:  The patient is alert and pleasant. She is in no apparent distress. She looks well.  Objective: Vital signs in last 24 hours: Temp:  [97.7 F (36.5 C)-98.1 F (36.7 C)] 97.7 F (36.5 C) (06/19 1452) Pulse Rate:  [94-97] 97 (06/19 1452) Resp:  [20-42] 42 (06/19 1452) BP: (148-168)/(58-76) 148/76 mmHg (06/19 1452) SpO2:  [95 %-97 %] 95 % (06/19 1452) Weight:  [112.492 kg (248 lb)] 112.492 kg (248 lb) (06/19 0813)  Intake/Output from previous day:   Intake/Output this shift: Total I/O In: 1500 [I.V.:1500] Out: 150 [Blood:150]  Physical exam the patient is alert and pleasant. She is moving all 4 extremities well.  Her dressing is clean and dry. There is no hematoma or shift.  Lab Results: No results for input(s): WBC, HGB, HCT, PLT in the last 72 hours. BMET No results for input(s): NA, K, CL, CO2, GLUCOSE, BUN, CREATININE, CALCIUM in the last 72 hours.  Studies/Results: No results found.  Assessment/Plan: The patient is doing well.      Suhailah Kwan D 08/19/2015, 3:09 PM

## 2015-08-19 NOTE — Anesthesia Preprocedure Evaluation (Signed)
Anesthesia Evaluation  Patient identified by MRN, date of birth, ID band Patient awake    Reviewed: Allergy & Precautions, H&P , Patient's Chart, lab work & pertinent test results, reviewed documented beta blocker date and time   Airway Mallampati: II  TM Distance: >3 FB Neck ROM: full    Dental no notable dental hx.    Pulmonary sleep apnea and Continuous Positive Airway Pressure Ventilation , COPD, former smoker,    Pulmonary exam normal breath sounds clear to auscultation       Cardiovascular hypertension, On Medications  Rhythm:regular Rate:Normal     Neuro/Psych    GI/Hepatic   Endo/Other  diabetes, Type obesity  Renal/GU      Musculoskeletal   Abdominal   Peds  Hematology   Anesthesia Other Findings   Reproductive/Obstetrics                             Anesthesia Physical Anesthesia Plan  ASA: II  Anesthesia Plan: General   Post-op Pain Management:    Induction: Intravenous  Airway Management Planned: Oral ETT  Additional Equipment:   Intra-op Plan:   Post-operative Plan: Extubation in OR  Informed Consent: I have reviewed the patients History and Physical, chart, labs and discussed the procedure including the risks, benefits and alternatives for the proposed anesthesia with the patient or authorized representative who has indicated his/her understanding and acceptance.   Dental Advisory Given and Dental advisory given  Plan Discussed with: CRNA and Surgeon  Anesthesia Plan Comments: (  Discussed general anesthesia, including possible nausea, instrumentation of airway, sore throat,pulmonary aspiration, etc. I asked if the were any outstanding questions, or  concerns before we proceeded. )        Anesthesia Quick Evaluation

## 2015-08-19 NOTE — Op Note (Signed)
Brief history: The patient is a 65 year old white female who has complained of neck, shoulder and arm pain consistent with a cervical radiculopathy. She has failed medical management and was worked up with a cervical MRI and my low CT. This demonstrated spondylosis and foraminal stenosis most prominent at C5-6 and C6-7. I discussed the situation with the patient. We discussed the various treatment options. The patient has weighed the risks, benefits, and alternatives and decided to proceed with a C5-6 and C6-7 anterior cervical discectomy, fusion, and plating.  Preoperative diagnosis: C5-6 and C6-7 disc degeneration, spondylosis, stenosis, cervical discopathy, cervicalgia  Postoperative diagnosis: The same  Procedure: C5-6 and C6-7 Anterior cervical discectomy/decompression; C5-6 and C6-7 interbody arthrodesis with local morcellized autograft bone and Kinnex bone graft extender; insertion of interbody prosthesis at C5-6 and C6-7 (Zimmer peek interbody prosthesis); anterior cervical plating from C5-C7 with globus titanium plate  Surgeon: Dr. Delma OfficerJeff Shareena Abbott  Asst.: Dr. Daryl EasternGary Kram  Anesthesia: Gen. endotracheal  Estimated blood loss: 100 mL  Drains: None  Complications: None  Description of procedure: The patient was brought to the operating room by the anesthesia team. General endotracheal anesthesia was induced. A roll was placed under the patient's shoulders to keep the neck in the neutral position. The patient's anterior cervical region was then prepared with Betadine scrub and Betadine solution. Sterile drapes were applied.  The area to be incised was then injected with Marcaine with epinephrine solution. I then used a scalpel to make a transverse incision in the patient's left anterior neck. I used the Metzenbaum scissors to divide the platysmal muscle and then to dissect medial to the sternocleidomastoid muscle, jugular vein, and carotid artery. I carefully dissected down towards the anterior  cervical spine identifying the esophagus and retracting it medially. Then using Kitner swabs to clear soft tissue from the anterior cervical spine. We then inserted a bent spinal needle into the upper exposed intervertebral disc space. We then obtained intraoperative radiographs confirm our location.  I then used electrocautery to detach the medial border of the longus colli muscle bilaterally from the C5-6 and C6-7 intervertebral disc spaces. I then inserted the Caspar self-retaining retractor underneath the longus colli muscle bilaterally to provide exposure.  We then incised the intervertebral disc at C5-6. We then performed a partial intervertebral discectomy with a pituitary forceps and the Karlin curettes. I then inserted distraction screws into the vertebral bodies at C5-6. We then distracted the interspace. We then used the high-speed drill to decorticate the vertebral endplates at C5-6, to drill away the remainder of the intervertebral disc, to drill away some posterior spondylosis, and to thin out the posterior longitudinal ligament. I then incised ligament with the arachnoid knife. We then removed the ligament with a Kerrison punches undercutting the vertebral endplates and decompressing the thecal sac. We then performed foraminotomies about the bilateral C6 nerve roots. This completed the decompression at this level.  We then repeated this procedure and analogous fashion at C6-7 decompressing the thecal sac and the bilateral C7 nerve roots.  We now turned our to attention to the interbody fusion. We used the trial spacers to determine the appropriate size for the interbody prosthesis. We then pre-filled prosthesis with a combination of local morcellized autograft bone that we obtained during decompression as well as Kinnex bone graft extender. We then inserted the prosthesis into the distracted interspace at C5-6 and C6-7. We then removed the distraction screws. There was a good snug fit of the  prosthesis in the interspace.  Having completed the fusion we now turned attention to the anterior spinal instrumentation. We used the high-speed drill to drill away some anterior spondylosis at the disc spaces so that the plate lay down flat. We selected the appropriate length titanium anterior cervical plate. We laid it along the anterior aspect of the vertebral bodies from C5-C7. We then drilled 12 mm holes at C5, C6 and C7. We then secured the plate to the vertebral bodies by placing two 12 mm self-tapping screws at C5, C6 and C7. We then obtained intraoperative radiograph. The demonstrating good position of the instrumentation. We therefore secured the screws the plate the locking each cam. This completed the instrumentation.  We then obtained hemostasis using bipolar electrocautery. We irrigated the wound out with bacitracin solution. We then removed the retractor. We inspected the esophagus for any damage. There was none apparent. We then reapproximated patient's platysmal muscle with interrupted 3-0 Vicryl suture. We then reapproximated the subcutaneous tissue with interrupted 3-0 Vicryl suture. The skin was reapproximated with Steri-Strips and benzoin. The wound was then covered with bacitracin ointment. A sterile dressing was applied. The drapes were removed. Patient was subsequently extubated by the anesthesia team and transported to the post anesthesia care unit in stable condition. All sponge instrument and needle counts were reportedly correct at the end of this case.

## 2015-08-19 NOTE — Anesthesia Postprocedure Evaluation (Signed)
Anesthesia Post Note  Patient: Anna Abbott  Procedure(s) Performed: Procedure(s) (LRB): Cervical five-six, Cervical six-seven Anterior cervical decompression/diskectomy/fusion/interbody prosthesis/plate (N/A)  Patient location during evaluation: PACU Anesthesia Type: General Level of consciousness: sedated Pain management: satisfactory to patient Vital Signs Assessment: post-procedure vital signs reviewed and stable Respiratory status: spontaneous breathing Cardiovascular status: stable Anesthetic complications: no Comments: PACU nurse said in recovery she may have described recall; she now denies any such complaint and is ready for D/C    Last Vitals:  Filed Vitals:   08/19/15 1524 08/19/15 1539  BP: 133/62 106/83  Pulse: 89 87  Temp:  36.6 C  Resp: 22 29    Last Pain:  Filed Vitals:   08/19/15 1540  PainSc: 5                  Camaria Gerald EDWARD

## 2015-08-20 DIAGNOSIS — M4722 Other spondylosis with radiculopathy, cervical region: Secondary | ICD-10-CM | POA: Diagnosis not present

## 2015-08-20 LAB — GLUCOSE, CAPILLARY: GLUCOSE-CAPILLARY: 201 mg/dL — AB (ref 65–99)

## 2015-08-20 MED ORDER — CYCLOBENZAPRINE HCL 10 MG PO TABS: 10.0000 mg | ORAL_TABLET | Freq: Three times a day (TID) | ORAL | Status: AC | PRN

## 2015-08-20 MED ORDER — OXYCODONE-ACETAMINOPHEN 5-325 MG PO TABS: 1.0000 | ORAL_TABLET | ORAL | Status: AC | PRN

## 2015-08-20 MED ORDER — DOCUSATE SODIUM 100 MG PO CAPS: 100.0000 mg | ORAL_CAPSULE | Freq: Two times a day (BID) | ORAL | Status: AC

## 2015-08-20 NOTE — Discharge Summary (Signed)
Physician Discharge Summary  Patient ID: Anna Abbott Single MRN: [id number] DOB/AGE: [birth year] 65 y.o.  Admit date: 08/19/2015 Discharge date: 08/20/2015  Admission Diagnoses:C5-6 and C6-7 spondylosis, cervical radiculopathy, cervical stenosis, cervicalgia  Discharge Diagnoses: The same Active Problems:   Cervical spondylosis with radiculopathy   Discharged Condition: good  Hospital Course: I performed a C5-6 and C6-7 anterior cervical discectomy, fusion, and plating on the patient on 08/19/2015. The surgery went well.  The patient's postoperative course was unremarkable. On postoperative day #1 the patient requested discharge home. She was given written and oral discharge instructions. All her questions were answered.  Consults: None Significant Diagnostic Studies: None Treatments: C5-6 and C6-7 anterior cervical discectomy, fusion, and plating. Discharge Exam: Blood pressure 129/63, pulse 86, temperature 97.8 F (36.6 C), temperature source Oral, resp. rate 20, weight 112.492 kg (248 lb), SpO2 94 %. The patient is alert and pleasant. She looks well. Her dressing has a small bloodstained. There is no hematoma or shift. She is moving all 4 extremities well.  Disposition: Home  Discharge Instructions    Call MD for:  difficulty breathing, headache or visual disturbances    Complete by:  As directed      Call MD for:  extreme fatigue    Complete by:  As directed      Call MD for:  hives    Complete by:  As directed      Call MD for:  persistant dizziness or light-headedness    Complete by:  As directed      Call MD for:  persistant nausea and vomiting    Complete by:  As directed      Call MD for:  redness, tenderness, or signs of infection (pain, swelling, redness, odor or green/yellow discharge around incision site)    Complete by:  As directed      Call MD for:  severe uncontrolled pain    Complete by:  As directed      Call MD for:  temperature >100.4    Complete by:  As  directed      Diet - low sodium heart healthy    Complete by:  As directed      Discharge instructions    Complete by:  As directed   Call [phone] for a followup appointment. Take a stool softener while you are using pain medications.     Driving Restrictions    Complete by:  As directed   Do not drive for 2 weeks.     Increase activity slowly    Complete by:  As directed      Lifting restrictions    Complete by:  As directed   Do not lift more than 5 pounds. No excessive bending or twisting.     May shower / Bathe    Complete by:  As directed   He may shower after the pain she is removed 3 days after surgery. Leave the incision alone.     Remove dressing in 48 hours    Complete by:  As directed   Your stitches are under the scan and will dissolve by themselves. The Steri-Strips will fall off after you take a few showers. Do not rub back or pick at the wound, Leave the wound alone.            Medication List    STOP taking these medications        diclofenac sodium 1 % Gel  Commonly known as:  VOLTAREN  HYDROcodone-acetaminophen 7.5-325 MG tablet  Commonly known as:  NORCO     RA ACETAMINOPHEN 650 MG CR tablet  Generic drug:  acetaminophen      TAKE these medications        amLODipine 10 MG tablet  Commonly known as:  NORVASC  Take 10 mg by mouth daily.     B-12 2000 MCG Tabs  Take 1 tablet by mouth daily.     CALCIUM 600 600 MG Tabs tablet  Generic drug:  calcium carbonate  Take 600 mg by mouth at bedtime.     cyclobenzaprine 10 MG tablet  Commonly known as:  FLEXERIL  Take 1 tablet (10 mg total) by mouth 3 (three) times daily as needed for muscle spasms.     docusate sodium 100 MG capsule  Commonly known as:  COLACE  Take 1 capsule (100 mg total) by mouth 2 (two) times daily.     DULoxetine 60 MG capsule  Commonly known as:  CYMBALTA  Take 120 mg by mouth every morning.     HM VITAMIN D3 4000 units Caps  Generic drug:  Cholecalciferol  Take  1 capsule by mouth daily.     hydrochlorothiazide 25 MG tablet  Commonly known as:  HYDRODIURIL  Take 25 mg by mouth daily.     lisinopril 40 MG tablet  Commonly known as:  PRINIVIL,ZESTRIL  Take 40 mg by mouth daily.     multivitamin with minerals tablet  Take 1 tablet by mouth daily.     nortriptyline 50 MG capsule  Commonly known as:  PAMELOR  Take 100 mg by mouth at bedtime.     Omega-3 1000 MG Caps  Take 3,000 mg by mouth daily.     oxyCODONE-acetaminophen 5-325 MG tablet  Commonly known as:  PERCOCET/ROXICET  Take 1-2 tablets by mouth every 4 (four) hours as needed for moderate pain.     pravastatin 40 MG tablet  Commonly known as:  PRAVACHOL  Take 40 mg by mouth at bedtime.         SignedCristi Loron 08/20/2015, 7:38 AM

## 2015-08-20 NOTE — Progress Notes (Signed)
Pt discharge education and instructions completed with pt at bedside; pt voices understanding and denied any questions. Pt IV removed; pt home DME cane delivered to pt at beside; pt handed her prescription for percocet. Pt neck incision dsg changed; clean, dry and intact. Pt discharge home with friend to transport her home. Pt transported off unit via wheelchair with friend and belongings to the side. Dionne BucyP. Amo Naoma Boxell RN

## 2015-08-22 ENCOUNTER — Encounter (HOSPITAL_COMMUNITY): Payer: Self-pay | Admitting: Neurosurgery

## 2015-11-12 ENCOUNTER — Other Ambulatory Visit: Payer: Self-pay | Admitting: Neurosurgery

## 2015-12-06 NOTE — Pre-Procedure Instructions (Signed)
    Abundio MiuLaQwiea Plott  12/06/2015      [facility] [address] - [place], [place] - [address] [place] [address] Phone: [phone] Fax: [phone]    Your procedure is scheduled on 12/16/15.  [facility] Admitting at 8 A.M.  Call this number if you have problems the morning of surgery:  [phone]   Remember:  Do not eat food or drink liquids after midnight.  Take these medicines the morning of surgery with A SIP OF WATER --tylenol,norvasc,cymbalta,hydrocodone,robaxin   Do not wear jewelry, make-up or nail polish.  Do not wear lotions, powders, or perfumes, or deoderant.  Do not shave 48 hours prior to surgery.  Men may shave face and neck.  Do not bring valuables to the hospital.  [facility] is not responsible for any belongings or valuables.  Contacts, dentures or bridgework may not be worn into surgery.  Leave your suitcase in the car.  After surgery it may be brought to your room.  For patients admitted to the hospital, discharge time will be determined by your treatment team.  Patients discharged the day of surgery will not be allowed to drive home.   Name and phone number of your driver:    Special instructions:  Do not take any aspirin,anti-inflammatories,vitamins,or herbal supplements 5-7 days prior to surgery.  Please read over the following fact sheets that you were given. MRSA Information

## 2015-12-09 ENCOUNTER — Encounter (HOSPITAL_COMMUNITY): Payer: Self-pay

## 2015-12-09 ENCOUNTER — Encounter (HOSPITAL_COMMUNITY)
Admission: RE | Admit: 2015-12-09 | Discharge: 2015-12-09 | Disposition: A | Discharge status: Home / Self Care | Payer: Medicare Other | Source: Ambulatory Visit | Attending: Neurosurgery | Admitting: Neurosurgery

## 2015-12-09 DIAGNOSIS — Z01812 Encounter for preprocedural laboratory examination: Secondary | ICD-10-CM | POA: Diagnosis not present

## 2015-12-09 HISTORY — DX: Depression, unspecified: F32.A

## 2015-12-09 HISTORY — DX: Major depressive disorder, single episode, unspecified: F32.9

## 2015-12-09 LAB — TYPE AND SCREEN
ABO/RH(D): O POS
Antibody Screen: NEGATIVE

## 2015-12-09 LAB — CBC
HCT: 43.6 % (ref 36.0–46.0)
Hemoglobin: 14.3 g/dL (ref 12.0–15.0)
MCH: 30.3 pg (ref 26.0–34.0)
MCHC: 32.8 g/dL (ref 30.0–36.0)
MCV: 92.4 fL (ref 78.0–100.0)
PLATELETS: 304 10*3/uL (ref 150–400)
RBC: 4.72 MIL/uL (ref 3.87–5.11)
RDW: 15.1 % (ref 11.5–15.5)
WBC: 11.4 10*3/uL — AB (ref 4.0–10.5)

## 2015-12-09 LAB — BASIC METABOLIC PANEL
ANION GAP: 11 (ref 5–15)
BUN: 9 mg/dL (ref 6–20)
CALCIUM: 9.7 mg/dL (ref 8.9–10.3)
CO2: 29 mmol/L (ref 22–32)
CREATININE: 0.61 mg/dL (ref 0.44–1.00)
Chloride: 96 mmol/L — ABNORMAL LOW (ref 101–111)
GLUCOSE: 126 mg/dL — AB (ref 65–99)
Potassium: 3.8 mmol/L (ref 3.5–5.1)
Sodium: 136 mmol/L (ref 135–145)

## 2015-12-09 LAB — SURGICAL PCR SCREEN
MRSA, PCR: NEGATIVE
STAPHYLOCOCCUS AUREUS: NEGATIVE

## 2015-12-09 LAB — ABO/RH: ABO/RH(D): O POS

## 2015-12-09 MED ORDER — CHLORHEXIDINE GLUCONATE CLOTH 2 % EX PADS: 6.0000 | MEDICATED_PAD | Freq: Once | CUTANEOUS | Status: DC

## 2015-12-10 LAB — HEMOGLOBIN A1C
Hgb A1c MFr Bld: 7.4 % — ABNORMAL HIGH (ref 4.8–5.6)
MEAN PLASMA GLUCOSE: 166 mg/dL

## 2015-12-16 ENCOUNTER — Inpatient Hospital Stay (HOSPITAL_COMMUNITY): Payer: Medicare Other | Admitting: Anesthesiology

## 2015-12-16 ENCOUNTER — Inpatient Hospital Stay (HOSPITAL_COMMUNITY)
Admission: RE | Admit: 2015-12-16 | Discharge: 2015-12-20 | DRG: 457 | Disposition: A | Discharge status: Home Health | Payer: Medicare Other | Source: Ambulatory Visit | Attending: Neurosurgery | Admitting: Neurosurgery

## 2015-12-16 ENCOUNTER — Encounter (HOSPITAL_COMMUNITY): Payer: Self-pay | Admitting: Urology

## 2015-12-16 ENCOUNTER — Inpatient Hospital Stay (HOSPITAL_COMMUNITY): Payer: Medicare Other

## 2015-12-16 ENCOUNTER — Encounter (HOSPITAL_COMMUNITY)
Admission: RE | Disposition: A | Discharge status: Home Health | Payer: Self-pay | Source: Ambulatory Visit | Attending: Neurosurgery

## 2015-12-16 DIAGNOSIS — Z6841 Body Mass Index (BMI) 40.0 and over, adult: Secondary | ICD-10-CM

## 2015-12-16 DIAGNOSIS — M5136 Other intervertebral disc degeneration, lumbar region: Secondary | ICD-10-CM | POA: Diagnosis present

## 2015-12-16 DIAGNOSIS — E785 Hyperlipidemia, unspecified: Secondary | ICD-10-CM | POA: Diagnosis present

## 2015-12-16 DIAGNOSIS — M545 Low back pain: Secondary | ICD-10-CM | POA: Diagnosis present

## 2015-12-16 DIAGNOSIS — E119 Type 2 diabetes mellitus without complications: Secondary | ICD-10-CM | POA: Diagnosis present

## 2015-12-16 DIAGNOSIS — M48061 Spinal stenosis, lumbar region without neurogenic claudication: Secondary | ICD-10-CM | POA: Diagnosis present

## 2015-12-16 DIAGNOSIS — G4730 Sleep apnea, unspecified: Secondary | ICD-10-CM | POA: Diagnosis present

## 2015-12-16 DIAGNOSIS — M5416 Radiculopathy, lumbar region: Secondary | ICD-10-CM | POA: Diagnosis present

## 2015-12-16 DIAGNOSIS — M4156 Other secondary scoliosis, lumbar region: Secondary | ICD-10-CM

## 2015-12-16 DIAGNOSIS — M419 Scoliosis, unspecified: Secondary | ICD-10-CM | POA: Diagnosis present

## 2015-12-16 DIAGNOSIS — Z419 Encounter for procedure for purposes other than remedying health state, unspecified: Secondary | ICD-10-CM

## 2015-12-16 DIAGNOSIS — K219 Gastro-esophageal reflux disease without esophagitis: Secondary | ICD-10-CM | POA: Diagnosis present

## 2015-12-16 DIAGNOSIS — I10 Essential (primary) hypertension: Secondary | ICD-10-CM | POA: Diagnosis present

## 2015-12-16 DIAGNOSIS — Z87891 Personal history of nicotine dependence: Secondary | ICD-10-CM

## 2015-12-16 DIAGNOSIS — J449 Chronic obstructive pulmonary disease, unspecified: Secondary | ICD-10-CM | POA: Diagnosis present

## 2015-12-16 LAB — GLUCOSE, CAPILLARY
GLUCOSE-CAPILLARY: 141 mg/dL — AB (ref 65–99)
GLUCOSE-CAPILLARY: 137 mg/dL — AB (ref 65–99)
GLUCOSE-CAPILLARY: 223 mg/dL — AB (ref 65–99)
Glucose-Capillary: 166 mg/dL — ABNORMAL HIGH (ref 65–99)

## 2015-12-16 SURGERY — POSTERIOR LUMBAR FUSION 3 LEVEL
Anesthesia: General

## 2015-12-16 MED ORDER — ROCURONIUM BROMIDE 10 MG/ML (PF) SYRINGE
PREFILLED_SYRINGE | INTRAVENOUS | Status: AC
Start: 2015-12-16 — End: 2015-12-16
  Filled 2015-12-16: qty 10

## 2015-12-16 MED ORDER — NORTRIPTYLINE HCL 25 MG PO CAPS
100.0000 mg | ORAL_CAPSULE | Freq: Every day | ORAL | Status: DC
  Administered 2015-12-16 – 2015-12-19 (×3): 100 mg via ORAL
  Filled 2015-12-16 (×5): qty 4

## 2015-12-16 MED ORDER — 0.9 % SODIUM CHLORIDE (POUR BTL) OPTIME
TOPICAL | Status: DC | PRN
  Administered 2015-12-16: 1000 mL

## 2015-12-16 MED ORDER — OXYCODONE HCL 5 MG PO TABS: 5.0000 mg | ORAL_TABLET | Freq: Once | ORAL | Status: DC | PRN

## 2015-12-16 MED ORDER — ALBUMIN HUMAN 5 % IV SOLN
INTRAVENOUS | Status: DC | PRN
  Administered 2015-12-16: 11:00:00 via INTRAVENOUS

## 2015-12-16 MED ORDER — GABAPENTIN 300 MG PO CAPS
300.0000 mg | ORAL_CAPSULE | Freq: Three times a day (TID) | ORAL | Status: DC
  Administered 2015-12-16 – 2015-12-20 (×11): 300 mg via ORAL
  Filled 2015-12-16 (×11): qty 1

## 2015-12-16 MED ORDER — SODIUM CHLORIDE 0.9 % IR SOLN
Status: DC | PRN
  Administered 2015-12-16: 11:00:00

## 2015-12-16 MED ORDER — ADULT MULTIVITAMIN W/MINERALS CH
1.0000 | ORAL_TABLET | Freq: Every day | ORAL | Status: DC
  Administered 2015-12-17 – 2015-12-20 (×4): 1 via ORAL
  Filled 2015-12-16 (×4): qty 1

## 2015-12-16 MED ORDER — CEFAZOLIN SODIUM-DEXTROSE 2-4 GM/100ML-% IV SOLN
2.0000 g | INTRAVENOUS | Status: AC
  Administered 2015-12-16 (×2): 2 g via INTRAVENOUS
  Filled 2015-12-16: qty 100

## 2015-12-16 MED ORDER — VANCOMYCIN HCL 1000 MG IV SOLR
INTRAVENOUS | Status: DC | PRN
  Administered 2015-12-16: 1000 mg via TOPICAL

## 2015-12-16 MED ORDER — FENTANYL CITRATE (PF) 100 MCG/2ML IJ SOLN
INTRAMUSCULAR | Status: AC
  Filled 2015-12-16: qty 4

## 2015-12-16 MED ORDER — PHENYLEPHRINE HCL 10 MG/ML IJ SOLN
INTRAMUSCULAR | Status: DC | PRN
  Administered 2015-12-16: 120 ug via INTRAVENOUS
  Administered 2015-12-16 (×2): 80 ug via INTRAVENOUS

## 2015-12-16 MED ORDER — THROMBIN 20000 UNITS EX SOLR
CUTANEOUS | Status: AC
  Filled 2015-12-16: qty 20000

## 2015-12-16 MED ORDER — EPHEDRINE 5 MG/ML INJ
INTRAVENOUS | Status: AC
  Filled 2015-12-16: qty 10

## 2015-12-16 MED ORDER — ALUM & MAG HYDROXIDE-SIMETH 200-200-20 MG/5ML PO SUSP: 30.0000 mL | Freq: Four times a day (QID) | ORAL | Status: DC | PRN

## 2015-12-16 MED ORDER — MORPHINE SULFATE (PF) 2 MG/ML IV SOLN
1.0000 mg | INTRAVENOUS | Status: DC | PRN
  Administered 2015-12-16: 4 mg via INTRAVENOUS
  Administered 2015-12-17: 2 mg via INTRAVENOUS
  Administered 2015-12-18: 4 mg via INTRAVENOUS
  Administered 2015-12-18: 2 mg via INTRAVENOUS
  Filled 2015-12-16: qty 2
  Filled 2015-12-16 (×2): qty 1
  Filled 2015-12-16: qty 2

## 2015-12-16 MED ORDER — MIDAZOLAM HCL 2 MG/2ML IJ SOLN
INTRAMUSCULAR | Status: AC
  Filled 2015-12-16: qty 2

## 2015-12-16 MED ORDER — LISINOPRIL 20 MG PO TABS
40.0000 mg | ORAL_TABLET | Freq: Every day | ORAL | Status: DC
  Administered 2015-12-17 – 2015-12-20 (×3): 40 mg via ORAL
  Filled 2015-12-16 (×4): qty 2

## 2015-12-16 MED ORDER — ALBUTEROL SULFATE HFA 108 (90 BASE) MCG/ACT IN AERS
INHALATION_SPRAY | RESPIRATORY_TRACT | Status: DC | PRN
  Administered 2015-12-16: 2 via RESPIRATORY_TRACT

## 2015-12-16 MED ORDER — HYDROCODONE-ACETAMINOPHEN 5-325 MG PO TABS
1.0000 | ORAL_TABLET | ORAL | Status: DC | PRN
  Administered 2015-12-19: 2 via ORAL
  Filled 2015-12-16: qty 2

## 2015-12-16 MED ORDER — CYCLOBENZAPRINE HCL 10 MG PO TABS: 10.0000 mg | ORAL_TABLET | Freq: Three times a day (TID) | ORAL | Status: DC | PRN

## 2015-12-16 MED ORDER — PHENYLEPHRINE 40 MCG/ML (10ML) SYRINGE FOR IV PUSH (FOR BLOOD PRESSURE SUPPORT)
PREFILLED_SYRINGE | INTRAVENOUS | Status: AC
  Filled 2015-12-16: qty 10

## 2015-12-16 MED ORDER — AMLODIPINE BESYLATE 10 MG PO TABS
10.0000 mg | ORAL_TABLET | Freq: Every day | ORAL | Status: DC
  Administered 2015-12-17 – 2015-12-20 (×4): 10 mg via ORAL
  Filled 2015-12-16 (×5): qty 1

## 2015-12-16 MED ORDER — PHENYLEPHRINE HCL 10 MG/ML IJ SOLN
INTRAMUSCULAR | Status: DC | PRN
  Administered 2015-12-16: 30 ug/min via INTRAVENOUS
  Administered 2015-12-16: 13:00:00 via INTRAVENOUS

## 2015-12-16 MED ORDER — LACTATED RINGERS IV SOLN
INTRAVENOUS | Status: DC
Start: 2015-12-16 — End: 2015-12-20

## 2015-12-16 MED ORDER — PRAVASTATIN SODIUM 40 MG PO TABS
40.0000 mg | ORAL_TABLET | Freq: Every day | ORAL | Status: DC
  Administered 2015-12-16 – 2015-12-19 (×4): 40 mg via ORAL
  Filled 2015-12-16 (×4): qty 1

## 2015-12-16 MED ORDER — HYDROCHLOROTHIAZIDE 25 MG PO TABS
25.0000 mg | ORAL_TABLET | Freq: Every day | ORAL | Status: DC
  Administered 2015-12-17 – 2015-12-20 (×4): 25 mg via ORAL
  Filled 2015-12-16 (×4): qty 1

## 2015-12-16 MED ORDER — MENTHOL 3 MG MT LOZG: 1.0000 | LOZENGE | OROMUCOSAL | Status: DC | PRN

## 2015-12-16 MED ORDER — CEFAZOLIN SODIUM-DEXTROSE 2-4 GM/100ML-% IV SOLN
2.0000 g | Freq: Three times a day (TID) | INTRAVENOUS | Status: AC
  Administered 2015-12-16 – 2015-12-17 (×2): 2 g via INTRAVENOUS
  Filled 2015-12-16 (×2): qty 100

## 2015-12-16 MED ORDER — KETAMINE HCL-SODIUM CHLORIDE 100-0.9 MG/10ML-% IV SOSY
PREFILLED_SYRINGE | INTRAVENOUS | Status: AC
  Filled 2015-12-16: qty 10

## 2015-12-16 MED ORDER — BUPIVACAINE LIPOSOME 1.3 % IJ SUSP
20.0000 mL | INTRAMUSCULAR | Status: AC
  Administered 2015-12-16: 20 mL
  Filled 2015-12-16: qty 20

## 2015-12-16 MED ORDER — BACITRACIN ZINC 500 UNIT/GM EX OINT
TOPICAL_OINTMENT | CUTANEOUS | Status: DC | PRN
  Administered 2015-12-16: 1 via TOPICAL

## 2015-12-16 MED ORDER — BUPIVACAINE-EPINEPHRINE (PF) 0.5% -1:200000 IJ SOLN
INTRAMUSCULAR | Status: DC | PRN
  Administered 2015-12-16: 10 mL

## 2015-12-16 MED ORDER — FENTANYL CITRATE (PF) 100 MCG/2ML IJ SOLN
INTRAMUSCULAR | Status: DC | PRN
  Administered 2015-12-16 (×3): 50 ug via INTRAVENOUS
  Administered 2015-12-16: 25 ug via INTRAVENOUS
  Administered 2015-12-16 (×3): 50 ug via INTRAVENOUS
  Administered 2015-12-16: 25 ug via INTRAVENOUS

## 2015-12-16 MED ORDER — GABAPENTIN 600 MG PO TABS: 300.0000 mg | ORAL_TABLET | Freq: Three times a day (TID) | ORAL | Status: DC

## 2015-12-16 MED ORDER — ALBUTEROL SULFATE HFA 108 (90 BASE) MCG/ACT IN AERS
INHALATION_SPRAY | RESPIRATORY_TRACT | Status: AC
Start: 2015-12-16 — End: 2015-12-16
  Filled 2015-12-16: qty 6.7

## 2015-12-16 MED ORDER — OXYCODONE HCL 5 MG/5ML PO SOLN: 5.0000 mg | Freq: Once | ORAL | Status: DC | PRN

## 2015-12-16 MED ORDER — MIDAZOLAM HCL 5 MG/5ML IJ SOLN
INTRAMUSCULAR | Status: DC | PRN
  Administered 2015-12-16: 2 mg via INTRAVENOUS

## 2015-12-16 MED ORDER — PROPOFOL 10 MG/ML IV BOLUS
INTRAVENOUS | Status: DC | PRN
  Administered 2015-12-16: 200 mg via INTRAVENOUS
  Administered 2015-12-16: 50 mg via INTRAVENOUS

## 2015-12-16 MED ORDER — EPHEDRINE SULFATE 50 MG/ML IJ SOLN
INTRAMUSCULAR | Status: DC | PRN
  Administered 2015-12-16 (×2): 10 mg via INTRAVENOUS
  Administered 2015-12-16: 5 mg via INTRAVENOUS

## 2015-12-16 MED ORDER — ONDANSETRON HCL 4 MG/2ML IJ SOLN: 4.0000 mg | INTRAMUSCULAR | Status: DC | PRN

## 2015-12-16 MED ORDER — VITAMIN D 1000 UNITS PO TABS
4000.0000 [IU] | ORAL_TABLET | Freq: Every day | ORAL | Status: DC
  Administered 2015-12-16 – 2015-12-20 (×5): 4000 [IU] via ORAL
  Filled 2015-12-16 (×5): qty 4

## 2015-12-16 MED ORDER — THROMBIN 20000 UNITS EX SOLR
CUTANEOUS | Status: DC | PRN
  Administered 2015-12-16: 11:00:00 via TOPICAL

## 2015-12-16 MED ORDER — OXYCODONE-ACETAMINOPHEN 5-325 MG PO TABS
1.0000 | ORAL_TABLET | ORAL | Status: DC | PRN
  Administered 2015-12-16 – 2015-12-20 (×13): 2 via ORAL
  Filled 2015-12-16 (×13): qty 2

## 2015-12-16 MED ORDER — BACITRACIN ZINC 500 UNIT/GM EX OINT
TOPICAL_OINTMENT | CUTANEOUS | Status: AC
  Filled 2015-12-16: qty 28.35

## 2015-12-16 MED ORDER — ROCURONIUM BROMIDE 100 MG/10ML IV SOLN
INTRAVENOUS | Status: DC | PRN
  Administered 2015-12-16: 30 mg via INTRAVENOUS
  Administered 2015-12-16: 10 mg via INTRAVENOUS
  Administered 2015-12-16: 20 mg via INTRAVENOUS
  Administered 2015-12-16: 50 mg via INTRAVENOUS
  Administered 2015-12-16 (×4): 10 mg via INTRAVENOUS

## 2015-12-16 MED ORDER — ACETAMINOPHEN 325 MG PO TABS: 650.0000 mg | ORAL_TABLET | ORAL | Status: DC | PRN

## 2015-12-16 MED ORDER — FENTANYL CITRATE (PF) 100 MCG/2ML IJ SOLN
INTRAMUSCULAR | Status: AC
  Filled 2015-12-16: qty 2

## 2015-12-16 MED ORDER — DOCUSATE SODIUM 100 MG PO CAPS
100.0000 mg | ORAL_CAPSULE | Freq: Two times a day (BID) | ORAL | Status: DC
  Administered 2015-12-16 – 2015-12-20 (×8): 100 mg via ORAL
  Filled 2015-12-16 (×8): qty 1

## 2015-12-16 MED ORDER — DIAZEPAM 5 MG PO TABS
ORAL_TABLET | ORAL | Status: AC
Start: 2015-12-16 — End: 2015-12-17
  Filled 2015-12-16: qty 1

## 2015-12-16 MED ORDER — KETAMINE HCL 10 MG/ML IJ SOLN
INTRAMUSCULAR | Status: DC | PRN
  Administered 2015-12-16: 30 mg via INTRAVENOUS
  Administered 2015-12-16 (×3): 10 mg via INTRAVENOUS

## 2015-12-16 MED ORDER — FENTANYL CITRATE (PF) 100 MCG/2ML IJ SOLN
25.0000 ug | INTRAMUSCULAR | Status: DC | PRN
  Administered 2015-12-16 (×2): 50 ug via INTRAVENOUS

## 2015-12-16 MED ORDER — FENTANYL CITRATE (PF) 100 MCG/2ML IJ SOLN
50.0000 ug | Freq: Once | INTRAMUSCULAR | Status: AC
Start: 2015-12-16 — End: 2015-12-16
  Administered 2015-12-16: 50 ug via INTRAVENOUS
  Filled 2015-12-16: qty 2

## 2015-12-16 MED ORDER — BISACODYL 10 MG RE SUPP: 10.0000 mg | Freq: Every day | RECTAL | Status: DC | PRN

## 2015-12-16 MED ORDER — LIDOCAINE HCL (CARDIAC) 20 MG/ML IV SOLN
INTRAVENOUS | Status: DC | PRN
  Administered 2015-12-16: 100 mg via INTRAVENOUS

## 2015-12-16 MED ORDER — LACTATED RINGERS IV SOLN
INTRAVENOUS | Status: DC
  Administered 2015-12-16 (×2): via INTRAVENOUS

## 2015-12-16 MED ORDER — SUGAMMADEX SODIUM 200 MG/2ML IV SOLN
INTRAVENOUS | Status: DC | PRN
  Administered 2015-12-16: 200 mg via INTRAVENOUS

## 2015-12-16 MED ORDER — HYDROCODONE-ACETAMINOPHEN 10-325 MG PO TABS
1.0000 | ORAL_TABLET | ORAL | Status: DC | PRN
  Administered 2015-12-20 (×2): 1 via ORAL
  Filled 2015-12-16 (×2): qty 1

## 2015-12-16 MED ORDER — VANCOMYCIN HCL 1000 MG IV SOLR
INTRAVENOUS | Status: AC
  Filled 2015-12-16: qty 1000

## 2015-12-16 MED ORDER — ONDANSETRON HCL 4 MG/2ML IJ SOLN
INTRAMUSCULAR | Status: DC | PRN
  Administered 2015-12-16: 4 mg via INTRAVENOUS

## 2015-12-16 MED ORDER — DULOXETINE HCL 60 MG PO CPEP
120.0000 mg | ORAL_CAPSULE | ORAL | Status: DC
  Administered 2015-12-17 – 2015-12-20 (×4): 120 mg via ORAL
  Filled 2015-12-16 (×2): qty 4
  Filled 2015-12-16 (×2): qty 2

## 2015-12-16 MED ORDER — PHENOL 1.4 % MT LIQD: 1.0000 | OROMUCOSAL | Status: DC | PRN

## 2015-12-16 MED ORDER — ACETAMINOPHEN 650 MG RE SUPP: 650.0000 mg | RECTAL | Status: DC | PRN

## 2015-12-16 MED ORDER — DIAZEPAM 5 MG PO TABS
5.0000 mg | ORAL_TABLET | Freq: Four times a day (QID) | ORAL | Status: DC | PRN
  Administered 2015-12-16 – 2015-12-20 (×7): 5 mg via ORAL
  Filled 2015-12-16 (×6): qty 1

## 2015-12-16 MED ORDER — BUPIVACAINE-EPINEPHRINE (PF) 0.5% -1:200000 IJ SOLN
INTRAMUSCULAR | Status: AC
  Filled 2015-12-16: qty 30

## 2015-12-16 MED ORDER — PROMETHAZINE HCL 25 MG/ML IJ SOLN: 6.2500 mg | INTRAMUSCULAR | Status: DC | PRN

## 2015-12-16 MED ORDER — PROPOFOL 10 MG/ML IV BOLUS
INTRAVENOUS | Status: AC
  Filled 2015-12-16: qty 20

## 2015-12-16 SURGICAL SUPPLY — 68 items
BAG DECANTER FOR FLEXI CONT (MISCELLANEOUS) ×3 IMPLANT
BENZOIN TINCTURE PRP APPL 2/3 (GAUZE/BANDAGES/DRESSINGS) ×3 IMPLANT
BLADE 15 SAFETY STRL DISP (BLADE) ×3 IMPLANT
BLADE CLIPPER SURG (BLADE) IMPLANT
BUR ACORN 6.0 (BURR) IMPLANT
BUR ACORN 6.0MM (BURR)
BUR MATCHSTICK NEURO 3.0 LAGG (BURR) ×6 IMPLANT
BUR PRECISION FLUTE 6.0 (BURR) ×3 IMPLANT
CANISTER SUCT 3000ML PPV (MISCELLANEOUS) ×3 IMPLANT
CAP REVERE LOCKING (Cap) ×30 IMPLANT
CLOSURE WOUND 1/2 X4 (GAUZE/BANDAGES/DRESSINGS) ×1
CONT SPEC 4OZ CLIKSEAL STRL BL (MISCELLANEOUS) ×3 IMPLANT
COVER BACK TABLE 60X90IN (DRAPES) IMPLANT
COVER TABLE BACK 60X90 (DRAPES) ×3 IMPLANT
DRAPE C-ARM 42X72 X-RAY (DRAPES) ×6 IMPLANT
DRAPE HALF SHEET 40X57 (DRAPES) ×3 IMPLANT
DRAPE LAPAROTOMY 100X72X124 (DRAPES) ×3 IMPLANT
DRAPE POUCH INSTRU U-SHP 10X18 (DRAPES) ×3 IMPLANT
DRAPE SURG 17X23 STRL (DRAPES) ×12 IMPLANT
ELECT BLADE 4.0 EZ CLEAN MEGAD (MISCELLANEOUS) ×3
ELECT REM PT RETURN 9FT ADLT (ELECTROSURGICAL) ×3
ELECTRODE BLDE 4.0 EZ CLN MEGD (MISCELLANEOUS) ×1 IMPLANT
ELECTRODE REM PT RTRN 9FT ADLT (ELECTROSURGICAL) ×1 IMPLANT
GAUZE SPONGE 4X4 12PLY STRL (GAUZE/BANDAGES/DRESSINGS) ×3 IMPLANT
GAUZE SPONGE 4X4 16PLY XRAY LF (GAUZE/BANDAGES/DRESSINGS) ×6 IMPLANT
GLOVE BIO SURGEON STRL SZ8 (GLOVE) ×6 IMPLANT
GLOVE BIO SURGEON STRL SZ8.5 (GLOVE) ×6 IMPLANT
GLOVE BIOGEL PI IND STRL 7.5 (GLOVE) ×1 IMPLANT
GLOVE BIOGEL PI INDICATOR 7.5 (GLOVE) ×2
GLOVE ECLIPSE 9.0 STRL (GLOVE) ×3 IMPLANT
GLOVE EXAM NITRILE LRG STRL (GLOVE) IMPLANT
GLOVE EXAM NITRILE XL STR (GLOVE) IMPLANT
GLOVE EXAM NITRILE XS STR PU (GLOVE) IMPLANT
GLOVE SURG SS PI 7.0 STRL IVOR (GLOVE) ×3 IMPLANT
GOWN STRL REUS W/ TWL LRG LVL3 (GOWN DISPOSABLE) IMPLANT
GOWN STRL REUS W/ TWL XL LVL3 (GOWN DISPOSABLE) ×3 IMPLANT
GOWN STRL REUS W/TWL 2XL LVL3 (GOWN DISPOSABLE) IMPLANT
GOWN STRL REUS W/TWL LRG LVL3 (GOWN DISPOSABLE)
GOWN STRL REUS W/TWL XL LVL3 (GOWN DISPOSABLE) ×6
KIT BASIN OR (CUSTOM PROCEDURE TRAY) ×3 IMPLANT
KIT INFUSE SMALL (Orthopedic Implant) ×3 IMPLANT
KIT ROOM TURNOVER OR (KITS) ×3 IMPLANT
MILL MEDIUM DISP (BLADE) ×3 IMPLANT
NEEDLE HYPO 21X1.5 SAFETY (NEEDLE) ×6 IMPLANT
NEEDLE HYPO 22GX1.5 SAFETY (NEEDLE) ×3 IMPLANT
NS IRRIG 1000ML POUR BTL (IV SOLUTION) ×3 IMPLANT
PACK LAMINECTOMY NEURO (CUSTOM PROCEDURE TRAY) ×3 IMPLANT
PAD ARMBOARD 7.5X6 YLW CONV (MISCELLANEOUS) ×9 IMPLANT
PATTIES SURGICAL .5 X.5 (GAUZE/BANDAGES/DRESSINGS) ×3 IMPLANT
PATTIES SURGICAL .5 X1 (DISPOSABLE) IMPLANT
ROD REVERE 6.35 STRAIGHT 125MM (Rod) ×6 IMPLANT
SCREW REVERE 6.35 6.5MMX45 (Screw) ×24 IMPLANT
SCREW REVERE 6.35 7.5X35 (Screw) ×6 IMPLANT
SPACER RISE 7-13MM 10X22 (Spacer) ×9 IMPLANT
SPONGE LAP 4X18 X RAY DECT (DISPOSABLE) IMPLANT
SPONGE NEURO XRAY DETECT 1X3 (DISPOSABLE) IMPLANT
SPONGE SURGIFOAM ABS GEL 100 (HEMOSTASIS) ×3 IMPLANT
STRIP BIOACTIVE 20CC 25X100X8 (Miscellaneous) ×3 IMPLANT
STRIP CLOSURE SKIN 1/2X4 (GAUZE/BANDAGES/DRESSINGS) ×2 IMPLANT
SUT VIC AB 1 CT1 18XBRD ANBCTR (SUTURE) ×3 IMPLANT
SUT VIC AB 1 CT1 8-18 (SUTURE) ×6
SUT VIC AB 2-0 CP2 18 (SUTURE) ×6 IMPLANT
SYRINGE 20CC LL (MISCELLANEOUS) ×3 IMPLANT
TAPE CLOTH SURG 4X10 WHT LF (GAUZE/BANDAGES/DRESSINGS) ×3 IMPLANT
TOWEL OR 17X24 6PK STRL BLUE (TOWEL DISPOSABLE) ×3 IMPLANT
TOWEL OR 17X26 10 PK STRL BLUE (TOWEL DISPOSABLE) ×3 IMPLANT
TRAY FOLEY W/METER SILVER 16FR (SET/KITS/TRAYS/PACK) ×3 IMPLANT
WATER STERILE IRR 1000ML POUR (IV SOLUTION) ×3 IMPLANT

## 2015-12-16 NOTE — Anesthesia Procedure Notes (Signed)
Procedure Name: Intubation Date/Time: 12/16/2015 9:59 AM Performed by: Adonis HousekeeperNGELL, Kenny Stern M Pre-anesthesia Checklist: Patient identified, Emergency Drugs available, Suction available, Patient being monitored and Timeout performed Patient Re-evaluated:Patient Re-evaluated prior to inductionOxygen Delivery Method: Circle system utilized Preoxygenation: Pre-oxygenation with 100% oxygen Intubation Type: IV induction Ventilation: Oral airway inserted - appropriate to patient size and Mask ventilation without difficulty Laryngoscope Size: Mac and 3 Grade View: Grade II Tube type: Oral Tube size: 7.0 mm Number of attempts: 1 Airway Equipment and Method: Stylet Placement Confirmation: ETT inserted through vocal cords under direct vision,  positive ETCO2,  CO2 detector and breath sounds checked- equal and bilateral Secured at: 20 cm Tube secured with: Tape Dental Injury: Teeth and Oropharynx as per pre-operative assessment

## 2015-12-16 NOTE — Anesthesia Preprocedure Evaluation (Signed)
Anesthesia Evaluation  Patient identified by MRN, date of birth, ID band Patient awake    Reviewed: Allergy & Precautions, H&P , Patient's Chart, lab work & pertinent test results, reviewed documented beta blocker date and time   Airway Mallampati: II  TM Distance: >3 FB Neck ROM: full    Dental no notable dental hx.    Pulmonary sleep apnea and Continuous Positive Airway Pressure Ventilation , COPD, former smoker,    Pulmonary exam normal breath sounds clear to auscultation       Cardiovascular hypertension, On Medications and Pt. on medications  Rhythm:regular Rate:Normal     Neuro/Psych PSYCHIATRIC DISORDERS Depression    GI/Hepatic GERD  Controlled,  Endo/Other  diabetes, Type obesity  Renal/GU      Musculoskeletal   Abdominal   Peds  Hematology   Anesthesia Other Findings   Reproductive/Obstetrics                             Anesthesia Physical  Anesthesia Plan  ASA: III  Anesthesia Plan: General   Post-op Pain Management:    Induction: Intravenous  Airway Management Planned: Oral ETT  Additional Equipment:   Intra-op Plan:   Post-operative Plan: Extubation in OR  Informed Consent: I have reviewed the patients History and Physical, chart, labs and discussed the procedure including the risks, benefits and alternatives for the proposed anesthesia with the patient or authorized representative who has indicated his/her understanding and acceptance.   Dental advisory given  Plan Discussed with: CRNA and Surgeon  Anesthesia Plan Comments: (   )        Anesthesia Quick Evaluation

## 2015-12-16 NOTE — H&P (Signed)
Subjective: The patient is a 65 year old white female who has complained of back and leg pain. She has had a prior lumbar fusion at L5-S1 by another physician years ago. She failed medical management was worked up with a lumbar MRI and x-rays. This demonstrated a lumbar scoliosis, stenosis, etc. I discussed the various treatment options with the patient including surgery. She has decided to proceed with a lumbar decompression, instrumentation, and fusion.   Past Medical History:  Diagnosis Date  . Arthritis   . Bell's palsy    "30 years ago, small case of it"  . COPD (chronic obstructive pulmonary disease) (HCC)    pt states diagnosed years ago but no problems  . Depression   . Diabetes mellitus without complication (HCC)    type II     no meds  . GERD (gastroesophageal reflux disease)    every now and then with eating, no medicine  . History of pneumonia   . Hyperlipidemia   . Hypertension   . Neuromuscular disorder (HCC)   . Sleep apnea    wears CPAP with nasal pillow and 2L oxygen    Past Surgical History:  Procedure Laterality Date  . ABDOMINAL HYSTERECTOMY    . ANKLE SURGERY Left   . ANTERIOR CERVICAL DECOMP/DISCECTOMY FUSION N/A 08/19/2015   Procedure: Cervical five-six, Cervical six-seven Anterior cervical decompression/diskectomy/fusion/interbody prosthesis/plate;  Surgeon: Tressie StalkerJeffrey Maalle Starrett, MD;  Location: [facility];  Service: Neurosurgery;  Laterality: N/A;  . APPENDECTOMY    . CARPAL TUNNEL RELEASE Left march 2017  . COLONOSCOPY    . LUMBAR FUSION  1992  . LUMBAR LAMINECTOMY  1988    Allergies  Allergen Reactions  . Niacin Other (See Comments)    Flushing * * REACTION NOT AN ALLERGY * *     [REACTION IS SIDE EFFECT]    Social History  Substance Use Topics  . Smoking status: Former Smoker    Packs/day: 2.00    Years: 35.00    Types: Cigarettes  . Smokeless tobacco: Former NeurosurgeonUser    Quit date: 05/03/2006  . Alcohol use No    History reviewed. No pertinent  family history. Prior to Admission medications   Medication Sig Start Date End Date Taking? Authorizing Provider  acetaminophen (TYLENOL 8 HOUR) 650 MG CR tablet Take 650 mg by mouth every 8 (eight) hours as needed for pain.   Yes Historical Provider, MD  amLODipine (NORVASC) 10 MG tablet Take 10 mg by mouth daily.  02/15/15  Yes Historical Provider, MD  calcium carbonate (CALCIUM 600) 600 MG TABS tablet Take 600 mg by mouth at bedtime.    Yes Historical Provider, MD  Cholecalciferol (HM VITAMIN D3) 4000 units CAPS Take 1 capsule by mouth daily.   Yes Historical Provider, MD  Cyanocobalamin (B-12) 2000 MCG TABS Take 2,000 mcg by mouth daily.    Yes Historical Provider, MD  diclofenac sodium (VOLTAREN) 1 % GEL Apply 2 g topically 4 (four) times daily as needed. For pain. 05/20/15  Yes Historical Provider, MD  DULoxetine (CYMBALTA) 60 MG capsule Take 120 mg by mouth every morning.  10/14/14  Yes Historical Provider, MD  hydrochlorothiazide (HYDRODIURIL) 25 MG tablet Take 25 mg by mouth daily.  01/02/15  Yes Historical Provider, MD  HYDROcodone-acetaminophen (NORCO) 10-325 MG tablet Take 1 tablet by mouth every 6 (six) hours as needed. For pain. 09/12/15  Yes Historical Provider, MD  lisinopril (PRINIVIL,ZESTRIL) 40 MG tablet Take 40 mg by mouth daily.  12/24/14  Yes Historical Provider,  MD  methocarbamol (ROBAXIN) 500 MG tablet Take 500 mg by mouth 3 (three) times daily.   Yes Historical Provider, MD  Multiple Vitamins-Minerals (MULTIVITAMIN WITH MINERALS) tablet Take 1 tablet by mouth daily.    Yes Historical Provider, MD  nortriptyline (PAMELOR) 50 MG capsule Take 100 mg by mouth at bedtime.   Yes Historical Provider, MD  Omega-3 1000 MG CAPS Take 1,000 mg by mouth 3 (three) times daily.  08/28/14  Yes Historical Provider, MD  pravastatin (PRAVACHOL) 40 MG tablet Take 40 mg by mouth at bedtime.   Yes Historical Provider, MD  cyclobenzaprine (FLEXERIL) 10 MG tablet Take 1 tablet (10 mg total) by mouth 3  (three) times daily as needed for muscle spasms. Patient not taking: Reported on 12/05/2015 08/20/15   Tressie Stalker, MD  docusate sodium (COLACE) 100 MG capsule Take 1 capsule (100 mg total) by mouth 2 (two) times daily. Patient not taking: Reported on 12/05/2015 08/20/15   Tressie Stalker, MD  oxyCODONE-acetaminophen (PERCOCET/ROXICET) 5-325 MG tablet Take 1-2 tablets by mouth every 4 (four) hours as needed for moderate pain. Patient not taking: Reported on 12/05/2015 08/20/15   Tressie Stalker, MD     Review of Systems  Positive ROS: As above  All other systems have been reviewed and were otherwise negative with the exception of those mentioned in the HPI and as above.  Objective: Vital signs in last 24 hours: Temp:  [97.9 F (36.6 C)] 97.9 F (36.6 C) (10/16 0710) Pulse Rate:  [84] 84 (10/16 0710) Resp:  [20] 20 (10/16 0710) BP: (123)/(51) 123/51 (10/16 0710) SpO2:  [96 %] 96 % (10/16 0710) Weight:  [116.1 kg (256 lb)] 116.1 kg (256 lb) (10/16 0710)  General Appearance: Alert, cooperative, no distress, Head: Normocephalic, without obvious abnormality, atraumatic Eyes: PERRL, conjunctiva/corneas clear, EOM's intact,    Ears: Normal  Throat: Normal  Neck: Supple, symmetrical, trachea midline, no adenopathy; thyroid: No enlargement/tenderness/nodules; no carotid bruit or JVD. The patient's cervical incision is well-healed. Back: Obese, scoliosis, her lumbar incision is well-healed. Lungs: Clear to auscultation bilaterally, respirations unlabored Heart: Regular rate and rhythm, no murmur, rub or gallop Abdomen: Soft, non-tender,, no masses, no organomegaly Extremities: Extremities normal, atraumatic, no cyanosis or edema Pulses: 2+ and symmetric all extremities Skin: Skin color, texture, turgor normal, no rashes or lesions  NEUROLOGIC:   Mental status: alert and oriented, no aphasia, good attention span, Fund of knowledge/ memory ok Motor Exam - grossly normal Sensory Exam -  grossly normal Reflexes:  Coordination - grossly normal Gait - grossly normal Balance - grossly normal Cranial Nerves: I: smell Not tested  II: visual acuity  OS: Normal  OD: Normal   II: visual fields Full to confrontation  II: pupils Equal, round, reactive to light  III,VII: ptosis None  III,IV,VI: extraocular muscles  Full ROM  V: mastication Normal  V: facial light touch sensation  Normal  V,VII: corneal reflex  Present  VII: facial muscle function - upper  Normal  VII: facial muscle function - lower Normal  VIII: hearing Not tested  IX: soft palate elevation  Normal  IX,X: gag reflex Present  XI: trapezius strength  5/5  XI: sternocleidomastoid strength 5/5  XI: neck flexion strength  5/5  XII: tongue strength  Normal    Data Review Lab Results  Component Value Date   WBC 11.4 (H) 12/09/2015   HGB 14.3 12/09/2015   HCT 43.6 12/09/2015   MCV 92.4 12/09/2015   PLT 304 12/09/2015  Lab Results  Component Value Date   NA 136 12/09/2015   K 3.8 12/09/2015   CL 96 (L) 12/09/2015   CO2 29 12/09/2015   BUN 9 12/09/2015   CREATININE 0.61 12/09/2015   GLUCOSE 126 (H) 12/09/2015   No results found for: INR, PROTIME  Assessment/Plan: L2-3, L3-4 and L4-5 scoliosis, spinal stenosis, lumbago, lumbar radiculopathy, neurogenic claudication: I discussed the situation with the patient. I have reviewed her imaging studies with her and pointed out the abnormalities. We have discussed the various treatment options including surgery. I described the surgical treatment option of an excoriation of her previous lumbar fusion with removal of the old hardware, and an L2-3, L3-4 and L4-5 decompression, instrumentation, and fusion. I described the surgery to her. I have shown her surgical models. We have discussed the risks, benefits, alternatives, expected postoperative course, and likelihood of achieving our goals with surgery. I have answered all her questions. She has decided to proceed  with surgery.   Janette Harvie D 12/16/2015 9:15 AM

## 2015-12-16 NOTE — Op Note (Signed)
Brief history: The patient is a 65 year old white female who's had a prior L5-S1 instrumentation and fusion by another physician many years ago. She has developed chronic back and leg pain. She has failed medical management. She was worked up with a lumbar myelo CT. This demonstrated the patient had a lumbar scoliosis and stenosis. I discussed situation with the patient. We discussed the various treatment options. She has decided to proceed with surgery after weighing the risks, benefits, and alternatives.  Preoperative diagnosis: L2-3, L3-4 and L4-5 scoliosis, Degenerative disc disease, spinal stenosis: lumbago; lumbar radiculopathy  Postoperative diagnosis: As above   Procedure: L4 laminectomy; bilateral L2-3 and L3-4 Laminotomy/foraminotomies to decompress the bilateral L2, L3, L4 and L5 nerve roots(the work required to do this was in addition to the work required to do the posterior lumbar interbody fusion because of the patient's spinal stenosis, facet arthropathy. Etc. requiring a wide decompression of the nerve roots.); L2-3, L3-4 and L4-5 transforaminal lumbar interbody fusion with local morselized autograft bone and Kinnex graft extender; insertion of interbody prosthesis at L2-3, L3-4 and L4-5 (globus peek expandable interbody prosthesis); posterior segmental instrumentation from L2 to S1 with globus titanium pedicle screws and rods; posterior lateral arthrodesis at L2-3, L3-4 and L4-5 with local morselized autograft bone and Kinnex bone graft extender; exploration of lumbar fusion/removal of Steffee instrumentation from L5-S1  Surgeon: Dr. Delma OfficerJeff Zayed Griffie  Asst.: Dr. Altamease OilerAndy Pool  Anesthesia: Gen. endotracheal  Estimated blood loss: 600 mL  Drains: None  Complications: None  Description of procedure: The patient was brought to the operating room by the anesthesia team. General endotracheal anesthesia was induced. The patient was turned to the prone position on the Wilson frame. The patient's  lumbosacral region was then prepared with Betadine scrub and Betadine solution. Sterile drapes were applied.  I then injected the area to be incised with Marcaine with epinephrine solution. I then used the scalpel to make a linear midline incision over the L2-3, L3-4, L4-5 and L5-S1 interspace. I then used electrocautery to perform a bilateral subperiosteal dissection exposing the spinous process and lamina of L2, L3, L4,  L5 and the upper sacrum. We exposed the hardware is at L5-S1 We then inserted the Verstrac retractor to provide exposure.  I explored the fusion by removing the old Steffee instrumentation at L5-S1. The arthrodesis appeared solid at L5-S1.    I began the decompression by incising the interspinous ligament at L3-4 and L4-5. I used a Leksell rongeur to remove the L4 spinous process. We then used the the high speed drill to perform laminotomies at L2-3, L3-4 and L4-5 on the right. We then used the Kerrison punches to widen the right L2-3, L3-4 and L4-5 laminotomies foraminotomies and removed the ligamentum flavum these levels. We used the Kerrison punches to remove the medial facets at L2-3, L3-4 and L4-5 bilaterally. We performed wide foraminotomies about the right L2, L3, L4 and L5 nerve roots completing the decompression.  We now turned our attention to the transforaminal lumbar interbody fusion. I used a scalpel to incise the intervertebral disc at L2-3, L3-4 and L4-5 on the right. I then performed a partial intervertebral discectomy at L2-3, L3-4 and L4-5 on the right using the pituitary forceps. We prepared the vertebral endplates at L2-3, L3-4 and L4-5 right for the fusion by removing the soft tissues with the curettes. We then used the trial spacers to pick the appropriate sized interbody prosthesis. We prefilled his prosthesis with a combination of local morselized autograft bone that  we obtained during the decompression as well as Kinnex bone graft extender. We inserted the  prefilled prosthesis into the interspace at L2-3, L3-4 and L4-5 from the right. We then expanded the prosthesis. There was a good snug fit of the prosthesis in the interspace. We then filled and the remainder of the intervertebral disc space with local morselized autograft bone and Kinnex. This completed the posterior lumbar interbody arthrodesis.  We now turned attention to the instrumentation. Under fluoroscopic guidance we cannulated the bilateral L2, L3, L4 pedicles with the bone probe. We then removed the bone probe. We then tapped the pedicle with 5.5 millimeter tap. We then removed the tap. We probed inside the tapped pedicle with a ball probe to rule out cortical breaches. We then inserted a 2.5 x 45 and 35 millimeter pedicle screw into the L2, L3, L4, L5 and S1 pedicles bilaterally under fluoroscopic guidance. We used the old screw holes at L5 and S1 area We then palpated along the medial aspect of the pedicles to rule out cortical breaches. There were none. The nerve roots were not injured. We then connected the unilateral pedicle screws with a lordotic rod. Using compression and distraction we partially reduce the patient's scoliosis. We then tightened the caps appropriately. This completed the instrumentation from L2-3 to S1 bilaterally.  We now turned our attention to the posterior lateral arthrodesis at L2-3, L3-4 and L4-5. We used the high-speed drill to decorticate the remainder of the facets, pars, transverse process at L2-3, L3-4 and L4-5 bilaterally. We then applied a combination of local morselized autograft bone and Kinnex bone graft extender over these decorticated posterior lateral structures. This completed the posterior lateral arthrodesis.  We then obtained hemostasis using bipolar electrocautery. We irrigated the wound out with bacitracin solution. We inspected the thecal sac and nerve roots and noted they were well decompressed. We then removed the retractor. We placed Achromycin  powder in the wound. We reapproximated patient's thoracolumbar fascia with interrupted #1 Vicryl suture. We reapproximated patient's subcutaneous tissue with interrupted 2-0 Vicryl suture. The reapproximated patient's skin with Steri-Strips and benzoin. The wound was then coated with bacitracin ointment. A sterile dressing was applied. The drapes were removed. The patient was subsequently returned to the supine position where they were extubated by the anesthesia team. He was then transported to the post anesthesia care unit in stable condition. All sponge instrument and needle counts were reportedly correct at the end of this case.

## 2015-12-16 NOTE — Transfer of Care (Signed)
Immediate Anesthesia Transfer of Care Note  Patient: Abundio MiuLaQwiea Freer  Procedure(s) Performed: Procedure(s) with comments: POSTERIOR LUMBAR INTERBODY FUSION, INTERBODY PROSTHESIS,POSTERIOR SEGMENTAL INSTRUMENTATION, POSTERIOR LATERAL ARTHRODESIS, LUMBAR TWO-LUMBAR THREE,LUMBAR THREE-LUMBAR FOUR,LUMBAR FOUR-LUMBAR FIVE (N/A) - POSTERIOR LUMBAR INTERBODY FUSION, INTERBODY PROSTHESIS,POSTERIOR SEGMENTAL INSTRUMENTATION, POSTERIOR LATERAL ARTHRODESIS, L2-L3,L3-L4,L4-L5  Patient Location: PACU  Anesthesia Type:General  Level of Consciousness: awake, alert , oriented and patient cooperative  Airway & Oxygen Therapy: Patient Spontanous Breathing and Patient connected to nasal cannula oxygen  Post-op Assessment: Report given to RN and Post -op Vital signs reviewed and stable  Post vital signs: Reviewed and stable  Last Vitals:  Vitals:   12/16/15 1620 12/16/15 1630  BP:  (!) 147/88  Pulse: 92 91  Resp: 17 14  Temp: 36.8 C     Last Pain:  Vitals:   12/16/15 1620  TempSrc:   PainSc: Asleep         Complications: No apparent anesthesia complications

## 2015-12-16 NOTE — Progress Notes (Signed)
Patient ID: Abundio MiuLaQwiea Knieriem, female   DOB: Oct 13, 1950, 65 y.o.   MRN: [id number] Subjective:  The patient is alert and pleasant. Her back is appropriately sore.  Objective: Vital signs in last 24 hours: Temp:  [97.9 F (36.6 C)-98.3 F (36.8 C)] 98.2 F (36.8 C) (10/16 1815) Pulse Rate:  [84-96] 96 (10/16 1815) Resp:  [13-20] 13 (10/16 1815) BP: (116-167)/(51-92) 116/67 (10/16 1815) SpO2:  [94 %-100 %] 97 % (10/16 1815) Weight:  [116.1 kg (256 lb)] 116.1 kg (256 lb) (10/16 0710)  Intake/Output from previous day: No intake/output data recorded. Intake/Output this shift: Total I/O In: 1960 [P.O.:50; I.V.:1200; Blood:160; Other:300; IV Piggyback:250] Out: 845 [Urine:395; Blood:450]  Physical exam the patient is alert. She has some weakness in her left foot but this is a chronic issue for her. Her dressing is clean and dry.  Lab Results: No results for input(s): WBC, HGB, HCT, PLT in the last 72 hours. BMET No results for input(s): NA, K, CL, CO2, GLUCOSE, BUN, CREATININE, CALCIUM in the last 72 hours.  Studies/Results: No results found.  Assessment/Plan: The patient is doing well.  LOS: 0 days     Kendryck Lacroix D 12/16/2015, 6:46 PM

## 2015-12-17 LAB — GLUCOSE, CAPILLARY
GLUCOSE-CAPILLARY: 172 mg/dL — AB (ref 65–99)
GLUCOSE-CAPILLARY: 178 mg/dL — AB (ref 65–99)
GLUCOSE-CAPILLARY: 204 mg/dL — AB (ref 65–99)
Glucose-Capillary: 138 mg/dL — ABNORMAL HIGH (ref 65–99)

## 2015-12-17 LAB — CBC
HCT: 31.6 % — ABNORMAL LOW (ref 36.0–46.0)
HEMOGLOBIN: 10.5 g/dL — AB (ref 12.0–15.0)
MCH: 29.8 pg (ref 26.0–34.0)
MCHC: 33.2 g/dL (ref 30.0–36.0)
MCV: 89.8 fL (ref 78.0–100.0)
PLATELETS: 222 10*3/uL (ref 150–400)
RBC: 3.52 MIL/uL — AB (ref 3.87–5.11)
RDW: 15.2 % (ref 11.5–15.5)
WBC: 11 10*3/uL — ABNORMAL HIGH (ref 4.0–10.5)

## 2015-12-17 LAB — BASIC METABOLIC PANEL
Anion gap: 7 (ref 5–15)
BUN: 5 mg/dL — ABNORMAL LOW (ref 6–20)
CHLORIDE: 100 mmol/L — AB (ref 101–111)
CO2: 29 mmol/L (ref 22–32)
CREATININE: 0.51 mg/dL (ref 0.44–1.00)
Calcium: 8.3 mg/dL — ABNORMAL LOW (ref 8.9–10.3)
Glucose, Bld: 129 mg/dL — ABNORMAL HIGH (ref 65–99)
Potassium: 3.8 mmol/L (ref 3.5–5.1)
SODIUM: 136 mmol/L (ref 135–145)

## 2015-12-17 MED FILL — Heparin Sodium (Porcine) Inj 1000 Unit/ML: INTRAMUSCULAR | Qty: 30 | Status: AC

## 2015-12-17 MED FILL — Sodium Chloride IV Soln 0.9%: INTRAVENOUS | Qty: 2000 | Status: AC

## 2015-12-17 NOTE — Progress Notes (Signed)
Patient ID: Anna Abbott, female   DOB: August 16, 1950, 65 y.o.   MRN: [id number] Subjective:  The patient is alert and pleasant. Her back is appropriately sure. She looks well. She is pleased that her left leg pain has resolved with surgery. She has some left leg numbness. Her left foot is weak but has been so for many years.  Objective: Vital signs in last 24 hours: Temp:  [97.8 F (36.6 C)-98.7 F (37.1 C)] 98.7 F (37.1 C) (10/17 0359) Pulse Rate:  [89-102] 96 (10/17 0748) Resp:  [13-19] 18 (10/17 0748) BP: (106-167)/(59-92) 106/59 (10/17 0748) SpO2:  [93 %-100 %] 93 % (10/17 0748)  Intake/Output from previous day: 10/16 0701 - 10/17 0700 In: 1960 [P.O.:50; I.V.:1200; Blood:160; IV Piggyback:250] Out: 2245 [Urine:1795; Blood:450] Intake/Output this shift: No intake/output data recorded.  Physical exam the patient is alert and pleasant. Her strength is grossly normal except in her left gastrocnemius and extensor hallucis longus is weak without change.  Lab Results:  Recent Labs  12/17/15 0340  WBC 11.0*  HGB 10.5*  HCT 31.6*  PLT 222   BMET  Recent Labs  12/17/15 0340  NA 136  K 3.8  CL 100*  CO2 29  GLUCOSE 129*  BUN 5*  CREATININE 0.51  CALCIUM 8.3*    Studies/Results: Dg Lumbar Spine 2-3 Views  Result Date: 12/16/2015 CLINICAL DATA:  Posterior lumbar interbody fusion at L2-L5. EXAM: LUMBAR SPINE - 2-3 VIEW COMPARISON:  Lumbar spine radiographs performed 11/12/2015 FINDINGS: Three fluoroscopic C-arm images are provided from the OR, demonstrating placement of posterior lumbar spinal fusion hardware across five vertebral bodies, with associated spacers. Visualized hardware appears intact. IMPRESSION: Status post posterior lumbar interbody fusion. Electronically Signed   By: Roanna RaiderJeffery  Chang M.D.   On: 12/16/2015 20:07   Dg C-arm 1-60 Min  Result Date: 12/16/2015 CLINICAL DATA:  Posterior lumbar interbody fusion at L2-L5. EXAM: LUMBAR SPINE - 2-3 VIEW  COMPARISON:  Lumbar spine radiographs performed 11/12/2015 FINDINGS: Three fluoroscopic C-arm images are provided from the OR, demonstrating placement of posterior lumbar spinal fusion hardware across five vertebral bodies, with associated spacers. Visualized hardware appears intact. IMPRESSION: Status post posterior lumbar interbody fusion. Electronically Signed   By: Roanna RaiderJeffery  Chang M.D.   On: 12/16/2015 20:07    Assessment/Plan: Postop day #1: The patient is doing well. We will mobilize with PT. She will likely go home in a few days with home therapy.  LOS: 1 day     Zohaib Heeney D 12/17/2015, 7:54 AM

## 2015-12-17 NOTE — Evaluation (Addendum)
Physical Therapy Evaluation Patient Details Name: Anna Abbott MRN: [id number] DOB: 01-24-1951 Today's Date: 12/17/2015   History of Present Illness  patient is a 65 yo female s/p Posterior lumbar interbody fusion at L2-L5  Clinical Impression  Patient seen for evaluation, mobilized patient to the bathroom. While in the bathroom, patient daughter becoming verbally aggressive toward therapist (nsg tech in room during entire event). Therapist deferred further evaluation at this time. Will request new therapist to work with patient next day. At this time, anticipate that patient [facility] 24[facility]. Will need continued skilled PT to address deficits and maximize function.     Follow Up Recommendations Home health PT;Supervision/Assistance - 24 hour    Equipment Recommendations  3in1 (PT)    Recommendations for Other Services       Precautions / Restrictions Precautions Precautions: Back Precaution Comments: attempted to review, pt expressed desire to use bathroom, unable to complete education at this time      Mobility  Bed Mobility               General bed mobility comments: received sitting EOB  Transfers Overall transfer level: Needs assistance Equipment used: Rolling walker (2 wheeled) Transfers: Sit to/from Stand Sit to Stand: Min assist         General transfer comment: min assist for safety and stability with modified hand placement on RW, VCs for hand placement with use of BSC upon reaching bathroom  Ambulation/Gait Ambulation/Gait assistance: Min guard Ambulation Distance (Feet): 12 Feet Assistive device: Rolling walker (2 wheeled) Gait Pattern/deviations: Step-to pattern;Decreased stride length;Shuffle;Decreased dorsiflexion - left;Wide base of support;Trunk flexed Gait velocity: decreased cadence Gait velocity interpretation: Below normal speed for age/gender General Gait Details: increased lateral sway with  ambulation  Stairs            Wheelchair Mobility    Modified Rankin (Stroke Patients Only)       Balance                                             Pertinent Vitals/Pain Pain Assessment: Faces Faces Pain Scale: Hurts little more    Home Living Family/patient expects to be discharged to:: Private residence Living Arrangements: Alone               Additional Comments: home set up unknown, unable to obtain at this time     Prior Function                 Hand Dominance   Dominant Hand: Right    Extremity/Trunk Assessment               Lower Extremity Assessment: LLE deficits/detail   LLE Deficits / Details: LLE foot drop at baseline  Cervical / Trunk Assessment:  (s/p spinal surgery, increased body habitus)  Communication      Cognition Arousal/Alertness: Awake/alert Behavior During Therapy: Impulsive (attempted cues for safety with mobility) Overall Cognitive Status: Within Functional Limits for tasks assessed                      General Comments      Exercises     Assessment/Plan    PT Assessment Patient needs continued PT services  PT Problem List Decreased strength;Decreased activity tolerance;Decreased balance;Decreased mobility;Decreased knowledge of use of DME;Decreased safety awareness;Decreased knowledge of  precautions;Obesity;Pain          PT Treatment Interventions DME instruction;Gait training;Functional mobility training;Therapeutic activities;Therapeutic exercise;Balance training;Patient/family education    PT Goals (Current goals can be found in the Care Plan section)  Acute Rehab PT Goals Time For Goal Achievement: 12/31/15 Potential to Achieve Goals: Good    Frequency Min 5X/week   Barriers to discharge        Co-evaluation               End of Session     Patient left: Other (comment) (in bathroom on commode with pull cord in reach) Nurse Communication: Mobility  status (advised equipment need, and limited evaluation due to escalation of family behaviors at this time)         Time: 0802-0811 PT Time Calculation (min) (ACUTE ONLY): 9 min   Charges:   PT Evaluation $PT Eval Moderate Complexity: 1 Procedure     PT G CodesFabio Asa 2015/12/21, 8:20 AM Charlotte Crumb, PT DPT  [phone]

## 2015-12-17 NOTE — Progress Notes (Signed)
Set pt up on Cpap 11.00 cmh2 pt states this is what she wears at home. Pt brought her home nasal pillows to use with our machine

## 2015-12-18 LAB — GLUCOSE, CAPILLARY
GLUCOSE-CAPILLARY: 154 mg/dL — AB (ref 65–99)
GLUCOSE-CAPILLARY: 131 mg/dL — AB (ref 65–99)
GLUCOSE-CAPILLARY: 157 mg/dL — AB (ref 65–99)
Glucose-Capillary: 136 mg/dL — ABNORMAL HIGH (ref 65–99)

## 2015-12-18 NOTE — Progress Notes (Signed)
Physical Therapy Treatment Patient Details Name: Nakira Litzau MRN: [id number] DOB: 1950-05-22 Today's Date: 12/18/2015    History of Present Illness patient is a 65 yo female s/p Posterior lumbar interbody fusion at L2-L5    PT Comments    Pt slowly progressing. Pt greatly limited by pain and decreased insight to deficits and safety. Pt has 8 steps to enter home and was unable to complete one. Pt very dependent on bilat UEs during ambulation due to R LE pain. Pt may need SNF upon d/c if pt can not complete stair negotiation safely or achieve supervision level of function.  Follow Up Recommendations  Home health PT;Supervision/Assistance - 24 hour     Equipment Recommendations  3in1 (PT)    Recommendations for Other Services       Precautions / Restrictions Precautions Precautions: Back Precaution Booklet Issued: Yes (comment) Precaution Comments: pt given handout and reports "Lavenia Atlas been doing all this", however requires max v/c's to adhere during function Required Braces or Orthoses: Spinal Brace Spinal Brace: Lumbar corset;Applied in sitting position Restrictions Weight Bearing Restrictions: No    Mobility  Bed Mobility Overal bed mobility: Needs Assistance Bed Mobility: Sidelying to Sit   Sidelying to sit: Min guard       General bed mobility comments: pt raised HOB all the way up, reports "i have a sleep number at home" pt dependent on bed rail, v/c's for optimal positioning/technique, labored effort, increased time  Transfers Overall transfer level: Needs assistance Equipment used: Rolling walker (2 wheeled) Transfers: Sit to/from Stand Sit to Stand: Min assist         General transfer comment: bed raised, maximal v/c's to push up from bed however pt con't to pull up on walker  Ambulation/Gait Ambulation/Gait assistance: Supervision Ambulation Distance (Feet): 100 Feet Assistive device: Rolling walker (2 wheeled) Gait Pattern/deviations: Step-to  pattern;Antalgic Gait velocity: decreased Gait velocity interpretation: Below normal speed for age/gender General Gait Details: pt with R LE limp, extremely slow, freq standing rest breaks, significant WBing on UEs   Stairs Stairs: Yes       General stair comments: attempted to ascend one stair x 6 minutes however pt unable. pt refused physical assist.   Wheelchair Mobility    Modified Rankin (Stroke Patients Only)       Balance Overall balance assessment: Needs assistance         Standing balance support: Bilateral upper extremity supported Standing balance-Leahy Scale: Poor Standing balance comment: requires bilat UE support                    Cognition Arousal/Alertness: Awake/alert Behavior During Therapy: Impulsive Overall Cognitive Status: Impaired/Different from baseline Area of Impairment: Safety/judgement     Memory: Decreased recall of precautions   Safety/Judgement: Decreased awareness of safety          Exercises      General Comments General comments (skin integrity, edema, etc.): spoke extensively about d/c and what she needs to do to go home safely      Pertinent Vitals/Pain Pain Assessment: 0-10 Pain Score: 9  Pain Location: back, R LE Pain Descriptors / Indicators: Burning;Stabbing;Radiating Pain Intervention(s): Patient requesting pain meds-RN notified    Home Living                      Prior Function            PT Goals (current goals can now be found in the care plan section) Acute  Rehab PT Goals Patient Stated Goal: home Progress towards PT goals: Progressing toward goals    Frequency    Min 5X/week      PT Plan Current plan remains appropriate    Co-evaluation             End of Session Equipment Utilized During Treatment: Back brace (pt refused gait belt due to being claustophobic) Activity Tolerance: Patient tolerated treatment well Patient left:  (in bathroom, CNA and RN aware)      Time: 0729-0809 PT Time Calculation (min) (ACUTE ONLY): 40 min  Charges:  $Gait Training: 23-37 mins $Therapeutic Activity: 8-22 mins                    G Codes:      Marcene BrawnChadwell, Daila Elbert Marie 12/18/2015, 8:39 AM   Lewis ShockAshly Martavious Hartel, PT, DPT Pager #: [phone] Office #: [phone]

## 2015-12-18 NOTE — Anesthesia Postprocedure Evaluation (Signed)
Anesthesia Post Note  Patient: Anna Abbott  Procedure(s) Performed: Procedure(s) (LRB): POSTERIOR LUMBAR INTERBODY FUSION, INTERBODY PROSTHESIS,POSTERIOR SEGMENTAL INSTRUMENTATION, POSTERIOR LATERAL ARTHRODESIS, LUMBAR TWO-LUMBAR THREE,LUMBAR THREE-LUMBAR FOUR,LUMBAR FOUR-LUMBAR FIVE (N/A)  Patient location during evaluation: PACU Anesthesia Type: General Level of consciousness: awake and alert Pain management: pain level controlled Vital Signs Assessment: post-procedure vital signs reviewed and stable Respiratory status: spontaneous breathing, nonlabored ventilation, respiratory function stable and patient connected to nasal cannula oxygen Cardiovascular status: blood pressure returned to baseline and stable Postop Assessment: no signs of nausea or vomiting Anesthetic complications: no    Last Vitals:  Vitals:   12/18/15 1217 12/18/15 1608  BP: (!) 111/43 (!) 118/51  Pulse: 85 88  Resp: 18 18  Temp: 36.8 C 36.8 C    Last Pain:  Vitals:   12/18/15 1344  TempSrc:   PainSc: 6                  [name] [name]

## 2015-12-18 NOTE — Progress Notes (Signed)
Patient ID: [name], female   DOB: [birth year], 65 y.o.   MRN: [id number] Subjective:  The patient is alert and pleasant. Her back is appropriately sore. She looks well.  Objective: Vital signs in last 24 hours: Temp:  [98.3 F (36.8 C)-98.6 F (37 C)] 98.3 F (36.8 C) (10/18 0745) Pulse Rate:  [93-106] 93 (10/18 0745) Resp:  [18] 18 (10/18 0745) BP: (101-122)/(48-81) 122/66 (10/18 0745) SpO2:  [93 %-95 %] 93 % (10/18 0745)  Intake/Output from previous day: 10/17 0701 - 10/18 0700 In: 240 [P.O.:240] Out: 900 [Urine:900] Intake/Output this shift: No intake/output data recorded.  Physical exam the patient is alert and oriented 3. She is moving her lower extremities well. She has chronic weakness in her left foot without change.  Her dressing is clean and dry.  Lab Results:  Recent Labs  12/17/15 0340  WBC 11.0*  HGB 10.5*  HCT 31.6*  PLT 222   BMET  Recent Labs  12/17/15 0340  NA 136  K 3.8  CL 100*  CO2 29  GLUCOSE 129*  BUN 5*  CREATININE 0.51  CALCIUM 8.3*    Studies/Results: Dg Lumbar Spine 2-3 Views  Result Date: 12/16/2015 CLINICAL DATA:  Posterior lumbar interbody fusion at L2-L5. EXAM: LUMBAR SPINE - 2-3 VIEW COMPARISON:  Lumbar spine radiographs performed 11/12/2015 FINDINGS: Three fluoroscopic C-arm images are provided from the OR, demonstrating placement of posterior lumbar spinal fusion hardware across five vertebral bodies, with associated spacers. Visualized hardware appears intact. IMPRESSION: Status post posterior lumbar interbody fusion. Electronically Signed   By: Anna Abbott.   On: 12/16/2015 20:07   Dg C-arm 1-60 Min  Result Date: 12/16/2015 CLINICAL DATA:  Posterior lumbar interbody fusion at L2-L5. EXAM: LUMBAR SPINE - 2-3 VIEW COMPARISON:  Lumbar spine radiographs performed 11/12/2015 FINDINGS: Three fluoroscopic C-arm images are provided from the OR, demonstrating placement of posterior lumbar spinal fusion hardware across  five vertebral bodies, with associated spacers. Visualized hardware appears intact. IMPRESSION: Status post posterior lumbar interbody fusion. Electronically Signed   By: Anna Abbott.   On: 12/16/2015 20:07    Assessment/Plan: Postop day #2: We will continue to mobilize the patient with physical therapy. She will likely go home on Friday. I'll asked Dr. Jordan Abbott to follow the patient in my absence.  LOS: 2 days     Anna Abbott 12/18/2015, 10:40 AM

## 2015-12-18 NOTE — Progress Notes (Signed)
Pt received at this time with no noted distress. Surgical gauze dressing site clean and intact. Vitals stable. Pt voided x1 with assistance. Pt oriented to room. Safety measures in place. Call bell within reach.

## 2015-12-19 LAB — GLUCOSE, CAPILLARY
GLUCOSE-CAPILLARY: 140 mg/dL — AB (ref 65–99)
Glucose-Capillary: 167 mg/dL — ABNORMAL HIGH (ref 65–99)
Glucose-Capillary: 146 mg/dL — ABNORMAL HIGH (ref 65–99)

## 2015-12-19 NOTE — Care Management Note (Signed)
Case Management Note  Patient Details  Name: Anna Abbott MRN: [id number] Date of Birth: Apr 01, 1950  Subjective/Objective:   Pt underwent: POSTERIOR LUMBAR INTERBODY FUSION, INTERBODY PROSTHESIS,POSTERIOR SEGMENTAL INSTRUMENTATION, POSTERIOR LATERAL ARTHRODESIS, LUMBAR TWO-LUMBAR THREE,LUMBAR THREE-LUMBAR FOUR,LUMBAR FOUR-LUMBAR FIVE. She is from home alone.                 Action/Plan: PT recommending HH services and 3 in 1. CM following for d/c needs.   Expected Discharge Date:                  Expected Discharge Plan:  Home w Home Health Services  In-House Referral:     Discharge planning Services     Post Acute Care Choice:    Choice offered to:     DME Arranged:    DME Agency:     HH Arranged:    HH Agency:     Status of Service:  In process, will continue to follow  If discussed at Long Length of Stay Meetings, dates discussed:    Additional Comments:  Kermit BaloKelli F Nyko Gell, RN 12/19/2015, 10:21 AM

## 2015-12-19 NOTE — Progress Notes (Signed)
Physical Therapy Treatment Patient Details Name: Abundio MiuLaQwiea Harpenau MRN: [id number] DOB: 1950-07-28 Today's Date: 12/19/2015    History of Present Illness patient is a 65 yo female s/p Posterior lumbar interbody fusion at L2-L5    PT Comments    Pt progressing steadily if pt is "allowed" to sleep in her recline.  Gait improving; pt completed stair training and general education is moving along well.  Follow Up Recommendations  Home health PT;Supervision/Assistance - 24 hour     Equipment Recommendations  3in1 (PT)    Recommendations for Other Services       Precautions / Restrictions Precautions Precautions: Back Precaution Booklet Issued: Yes (comment) Precaution Comments: pt given handout and reports "Lavenia Atlasve been doing all this", however requires max v/c's to adhere during function Required Braces or Orthoses: Spinal Brace Spinal Brace: Lumbar corset;Applied in sitting position    Mobility  Bed Mobility               General bed mobility comments: pt reports she will be in lift chair initially  Transfers Overall transfer level: Needs assistance Equipment used: Rolling walker (2 wheeled) Transfers: Sit to/from Stand Sit to Stand: Supervision         General transfer comment: cues for hand placement on the bed.  Ambulation/Gait Ambulation/Gait assistance: Supervision Ambulation Distance (Feet): 300 Feet Assistive device: Rolling walker (2 wheeled) Gait Pattern/deviations: Step-through pattern Gait velocity: decreased Gait velocity interpretation: Below normal speed for age/gender General Gait Details: generally steady with occasional episode of pain where pt winces and gait falters.   Stairs Stairs: Yes Stairs assistance: Min guard Stair Management: One rail Right;Step to pattern;Forwards Number of Stairs: 5 General stair comments: Generally smooth even with steps slightly higher than those at her home.  Wheelchair Mobility    Modified Rankin (Stroke  Patients Only)       Balance Overall balance assessment: Needs assistance   Sitting balance-Leahy Scale: Good     Standing balance support: Bilateral upper extremity supported Standing balance-Leahy Scale: Fair Standing balance comment: reliant on RW or external support in general                    Cognition Arousal/Alertness: Awake/alert   Overall Cognitive Status: Within Functional Limits for tasks assessed                      Exercises      General Comments General comments (skin integrity, edema, etc.): Reviewed home situation and how close to managing each activity at home.  Reinforced back safety      Pertinent Vitals/Pain Pain Assessment: 0-10 Pain Score: 8  Pain Location: back Pain Descriptors / Indicators: Aching;Sore Pain Intervention(s): Monitored during session;Repositioned    Home Living                      Prior Function            PT Goals (current goals can now be found in the care plan section) Acute Rehab PT Goals Patient Stated Goal: home Time For Goal Achievement: 12/31/15 Potential to Achieve Goals: Good Progress towards PT goals: Progressing toward goals    Frequency    Min 5X/week      PT Plan Current plan remains appropriate    Co-evaluation             End of Session Equipment Utilized During Treatment: Back brace Activity Tolerance: Patient tolerated treatment well Patient left: in chair;with  call bell/phone within reach;with family/visitor present     Time: 1610-9604 PT Time Calculation (min) (ACUTE ONLY): 40 min  Charges:  $Gait Training: 8-22 mins $Therapeutic Activity: 23-37 mins                    G Codes:      Ezri Landers, Eliseo Gum 12/19/2015, 1:21 PM 12/19/2015  Marinette Bing, PT [phone]  (pager)

## 2015-12-19 NOTE — Progress Notes (Signed)
Placed patient on CPAP for the night set at 11cm

## 2015-12-19 NOTE — Progress Notes (Signed)
Patient ID: Anna Abbott, female   DOB: 12-12-1950, 65 y.o.   MRN: [id number]  BP (!) 106/57 (BP Location: Right Arm)   Pulse 82   Temp 98.5 F (36.9 C) (Oral)   Resp 17   Wt 116.1 kg (256 lb)   SpO2 92%   BMI 43.94 kg/m  Alert and oriented x 4 Moving all extremities well Continue to work with pt.

## 2015-12-19 NOTE — Progress Notes (Signed)
Patient A/O, no noted distress. Administered prn pain regimen, to assist with managing pain. Patient is pleasant. Independently ambulates in room. She rotate usage of cane and walker. Educated patient on medications, tolerated well. Sometimes patient pain spikes high, but she is able to not  "focus so much on the pain." She is Native [place], "[place]." She likes to do for her self and staff respected her independence. Patient has been OOB in chair most of shift. In her home settings she does not sleep in bed. Held Lisinopril resulted in first set morning BP, rechecked later and did administered. Patient notes she does not use any home remedy to assist with maintenance of BP. Staff will continue to monitor and meet needs.

## 2015-12-20 LAB — GLUCOSE, CAPILLARY: GLUCOSE-CAPILLARY: 157 mg/dL — AB (ref 65–99)

## 2015-12-20 MED ORDER — HYDROCODONE-ACETAMINOPHEN 10-325 MG PO TABS: 1.0000 | ORAL_TABLET | Freq: Four times a day (QID) | ORAL | 0 refills | Status: AC | PRN

## 2015-12-20 MED ORDER — METHOCARBAMOL 500 MG PO TABS: 500.0000 mg | ORAL_TABLET | Freq: Three times a day (TID) | ORAL | 0 refills | Status: AC

## 2015-12-20 NOTE — Care Management Note (Addendum)
Case Management Note  Patient Details  Name: Anna Abbott MRN: [id number] Date of Birth: [birth year]  Subjective/Objective:                    Action/Plan: Pt discharging home with [facility] services. CM met with the patient and provided her a list of [place] agencies in the [facility] area. [facility]. [facility] notified and accepted the referral. Pt address is: [address] in [place], [place] [address]. Pt already has 3 in 1 at the bedside. Pt asking for prescription for lift chair. Dr Christella Noa informed the patient she can pick up the prescription at the office. Bedside RN updated.   Expected Discharge Date:                  Expected Discharge Plan:  [place]  In-House Referral:     Discharge planning Services  CM Consult  Post Acute Care Choice:  Durable Medical Equipment, Home Health Choice offered to:  Patient  DME Arranged:  3-N-1 DME Agency:  [place]:  PT [place]:  [facility] (now [facility])  Status of Service:  Completed, signed off  If discussed at [place] of Stay Meetings, dates discussed:    Additional Comments:  Pollie Friar, RN 12/20/2015, 2:49 PM

## 2015-12-20 NOTE — Progress Notes (Signed)
Discharge orders received.  Discharge instructions and follow-up appointments reviewed with the patient.  VSS upon discharge.  IV removed and education complete.  All belongings sent with the patient.  Transported out via wheelchair. Lamia Mariner M, RN   

## 2015-12-20 NOTE — Discharge Summary (Signed)
Physician Discharge Summary  Patient ID: Anna Abbott MRN: [id number] DOB/AGE: 1950/07/15 65 y.o.  Admit date: 12/16/2015 Discharge date: 12/20/2015  Admission Diagnoses:Lumbar scoliosis,L2-3, L3-4 and L4-5 scoliosis, Degenerative disc disease, spinal stenosis: lumbago; lumbar radiculopathy  Discharge Diagnoses: L2-3, L3-4 and L4-5 scoliosis, Degenerative disc disease, spinal stenosis: lumbago; lumbar radiculopathy  Active Problems:   Lumbar scoliosis   Discharged Condition: good  Hospital Course: Mrs. Anna Abbott presented severe pain in the lower back. She was taken to the OR for an uncomplicated lumbar fusion at L2-L5. Post op she has voided, ambulated with physical therapy, and tolerated a regular diet. Her wound is clean, dry, and without signs of infection.  Treatments: surgery: L4 laminectomy; bilateral L2-3 and L3-4 Laminotomy/foraminotomies to decompress the bilateral L2, L3, L4 and L5 nerve roots(the work required to do this was in addition to the work required to do the posterior lumbar interbody fusion because of the patient's spinal stenosis, facet arthropathy. Etc. requiring a wide decompression of the nerve roots.); L2-3, L3-4 and L4-5 transforaminal lumbar interbody fusion with local morselized autograft bone and Kinnex graft extender; insertion of interbody prosthesis at L2-3, L3-4 and L4-5 (globus peek expandable interbody prosthesis); posterior segmental instrumentation from L2 to S1 with globus titanium pedicle screws and rods; posterior lateral arthrodesis at L2-3, L3-4 and L4-5 with local morselized autograft bone and Kinnex bone graft extender; exploration of lumbar fusion/removal of Steffee instrumentation from L5-S1   Discharge Exam: Blood pressure (!) 115/54, pulse 91, temperature 97.8 F (36.6 C), temperature source Oral, resp. rate 20, weight 116.1 kg (256 lb), SpO2 91 %. General appearance: alert, cooperative, appears stated age and mild distress Neurologic:  Alert and oriented X 3, normal strength and tone. Normal symmetric reflexes. Normal coordination and gait  Disposition: 01-Home or Self Care SPONDYLOLISTHESIS, LUMBAR REGION Discharge Instructions    Ambulatory referral to Home Health    Complete by:  As directed    Please evaluate [name] for admission to [facility].  Disciplines requested: Physical Therapy  Services to provide: Other: physical therapy  Physician to follow patient's care (the person listed here will be responsible for signing ongoing orders): Referring Provider  Requested Start of Care Date: Within 2-3 days  I certify that this patient is under my care and that I, or a Nurse Practitioner or Physician's Assistant working with me, had a face-to-face encounter that meets the physician face-to-face requirements with patient on 12/20/2015. The encounter with the patient was in whole, or in part for the following medical condition(s) which is the primary reason for home health care (List medical condition). Lumbar stenosis  Special Instructions:  pt   Does the patient have Medicare or Medicaid?:  Yes   The encounter with the patient was in whole, or in part, for the following medical condition, which is the primary reason for home health care:  lumbar stenosis with neurogenic claudication   Reason for Medically Necessary Home Health Services:  Therapy- Investment banker, operational, Patent examiner   My clinical findings support the need for the above services:  Unable to leave home safely without assistance and/or assistive device   I certify that, based on my findings, the following services are medically necessary home health services:  Physical therapy   Further, I certify that my clinical findings support that this patient is homebound due to:  Unable to leave home safely without assistance   For home use only DME Bedside commode    Complete by:  As directed  Medication List    TAKE these  medications   amLODipine 10 MG tablet Commonly known as:  NORVASC Take 10 mg by mouth daily.   B-12 2000 MCG Tabs Take 2,000 mcg by mouth daily.   CALCIUM 600 600 MG Tabs tablet Generic drug:  calcium carbonate Take 600 mg by mouth at bedtime.   cyclobenzaprine 10 MG tablet Commonly known as:  FLEXERIL Take 1 tablet (10 mg total) by mouth 3 (three) times daily as needed for muscle spasms.   docusate sodium 100 MG capsule Commonly known as:  COLACE Take 1 capsule (100 mg total) by mouth 2 (two) times daily.   DULoxetine 60 MG capsule Commonly known as:  CYMBALTA Take 120 mg by mouth every morning.   HM VITAMIN D3 4000 units Caps Generic drug:  Cholecalciferol Take 1 capsule by mouth daily.   hydrochlorothiazide 25 MG tablet Commonly known as:  HYDRODIURIL Take 25 mg by mouth daily.   HYDROcodone-acetaminophen 10-325 MG tablet Commonly known as:  NORCO Take 1-2 tablets by mouth every 6 (six) hours as needed for moderate pain. What changed:  how much to take  reasons to take this  additional instructions   lisinopril 40 MG tablet Commonly known as:  PRINIVIL,ZESTRIL Take 40 mg by mouth daily.   methocarbamol 500 MG tablet Commonly known as:  ROBAXIN Take 1 tablet (500 mg total) by mouth 3 (three) times daily.   multivitamin with minerals tablet Take 1 tablet by mouth daily.   nortriptyline 50 MG capsule Commonly known as:  PAMELOR Take 100 mg by mouth at bedtime.   Omega-3 1000 MG Caps Take 1,000 mg by mouth 3 (three) times daily.   oxyCODONE-acetaminophen 5-325 MG tablet Commonly known as:  PERCOCET/ROXICET Take 1-2 tablets by mouth every 4 (four) hours as needed for moderate pain.   pravastatin 40 MG tablet Commonly known as:  PRAVACHOL Take 40 mg by mouth at bedtime.   TYLENOL 8 HOUR 650 MG CR tablet Generic drug:  acetaminophen Take 650 mg by mouth every 8 (eight) hours as needed for pain.   VOLTAREN 1 % Gel Generic drug:  diclofenac  sodium Apply 2 g topically 4 (four) times daily as needed. For pain.      Follow-up Information    Cristi LoronJENKINS,JEFFREY D, MD. Call today.   Specialty:  Neurosurgery Why:  to make a follow up appointment. [phone] Contact information: [address] [place] [address] [phone]           Signed: Carmela HurtCABBELL,Angie Hogg L 12/20/2015, 10:59 AM

## 2015-12-20 NOTE — Progress Notes (Signed)
Swelling at mid back left and right side incision. Patient repositioned on left side, ice applied to incision. See MAR. RN will continue to monitor.

## 2015-12-20 NOTE — Discharge Instructions (Signed)
Spinal Fusion, Care After °Refer to this sheet in the next few weeks. These instructions provide you with information on caring for yourself after your procedure. Your caregiver may also give you more specific instructions. Your treatment has been planned according to current medical practices, but problems sometimes occur. Call your caregiver if you have any problems or questions after your procedure. °HOME CARE INSTRUCTIONS  °· Take whatever pain medicine has been prescribed by your caregiver. Do not take over-the-counter pain medicine unless directed otherwise by your caregiver. °· Do not drive if you are taking narcotic pain medicines. °· Change your bandage (dressing) if necessary or as directed by your caregiver. °· Do not get your surgical cut (incision) wet. After a few days you may take quick showers (rather than baths), but keep your incision clean and dry. Covering the incision with plastic wrap while you shower should keep your incision dry. A few weeks after surgery, once your incision has healed and your caregiver says it is okay, you can take baths or go swimming. °· If you have been prescribed medicine to prevent your blood from clotting, follow the directions carefully. °· Check the area around your incision often. Look for redness and swelling. Also, look for anything leaking from your wound. You can use a mirror or have a family member inspect your incision if it is in a place where it is difficult for you to see. °· Ask your caregiver what activities you should avoid and for how long. °· Walk as much as possible. °· Do not lift anything heavier than 10 pounds (4.5 kilograms) until your caregiver says it is safe. °· Do not twist or bend for a few weeks. Try not to pull on things. Avoid sitting for long periods of time. Change positions at least every hour. °· Ask your caregiver what kinds of exercise you should do to make your back stronger and when you should begin doing these exercises. °SEEK  IMMEDIATE MEDICAL CARE IF:  °· Pain suddenly becomes much worse. °· The incision area is red, swollen, bleeding, or leaking fluid. °· Your legs or feet become increasingly painful, numb, weak, or swollen. °· You have trouble controlling urination or bowel movements. °· You have trouble breathing. °· You have chest pain. °· You have a fever. °MAKE SURE YOU: °· Understand these instructions. °· Will watch your condition. °· Will get help right away if you are not doing well or get worse. °  °This information is not intended to replace advice given to you by your health care provider. Make sure you discuss any questions you have with your health care provider. °  °Document Released: 09/05/2004 Document Revised: 03/09/2014 Document Reviewed: 08/01/2014 °Elsevier Interactive Patient Education ©2016 Elsevier Inc. ° °

## 2015-12-20 NOTE — Progress Notes (Signed)
Physical Therapy Treatment Patient Details Name: Anna Abbott MRN: [id number] DOB: 23-May-1950 Today's Date: 12/20/2015    History of Present Illness patient is a 65 yo female s/p Posterior lumbar interbody fusion at L2-L5    PT Comments    Progressing with gait mobility, activity tolerance.  Reviewed back education.  3 in 1 has arrived.  Daughter to stay a few days with her.   Follow Up Recommendations  Home health PT;Supervision/Assistance - 24 hour     Equipment Recommendations  3in1 (PT)    Recommendations for Other Services       Precautions / Restrictions Precautions Precautions: Back Precaution Booklet Issued: Yes (comment) Spinal Brace: Other (comment) (per Dr Mikal Planeabell, can go without brace if she must) Restrictions Weight Bearing Restrictions: No    Mobility  Bed Mobility               General bed mobility comments: pt reports she will be in lift chair initially  Transfers Overall transfer level: Needs assistance Equipment used: Rolling walker (2 wheeled) Transfers: Sit to/from Stand Sit to Stand: Supervision         General transfer comment: cues for hand placement  Ambulation/Gait Ambulation/Gait assistance: Supervision Ambulation Distance (Feet): 300 Feet (x2) Assistive device: Rolling walker (2 wheeled) Gait Pattern/deviations: Step-through pattern Gait velocity: decreased Gait velocity interpretation: at or above normal speed for age/gender General Gait Details: steady, but slow   Stairs            Wheelchair Mobility    Modified Rankin (Stroke Patients Only)       Balance     Sitting balance-Leahy Scale: Good       Standing balance-Leahy Scale: Fair                      Cognition Arousal/Alertness: Awake/alert Behavior During Therapy: WFL for tasks assessed/performed Overall Cognitive Status: Within Functional Limits for tasks assessed                      Exercises      General Comments  General comments (skin integrity, edema, etc.): reviewed back education and typical progression of activity      Pertinent Vitals/Pain Pain Assessment: Faces Faces Pain Scale: Hurts little more Pain Location: back Pain Descriptors / Indicators: Aching;Sore Pain Intervention(s): Monitored during session;Repositioned    Home Living                      Prior Function            PT Goals (current goals can now be found in the care plan section) Acute Rehab PT Goals Patient Stated Goal: home Time For Goal Achievement: 12/31/15 Potential to Achieve Goals: Good Progress towards PT goals: Progressing toward goals    Frequency    Min 5X/week      PT Plan Current plan remains appropriate    Co-evaluation             End of Session     Patient left: in chair;with call bell/phone within reach;with family/visitor present     Time: 4098-11911048-1130 PT Time Calculation (min) (ACUTE ONLY): 42 min  Charges:  $Gait Training: 23-37 mins $Therapeutic Activity: 8-22 mins                    G Codes:      Belanna Manring, Eliseo GumKenneth V 12/20/2015, 12:34 PM 12/20/2015  Baird BingKen Kym Scannell, PT [phone]  (pager)

## 2015-12-20 NOTE — Care Management Important Message (Signed)
Important Message  Patient Details  Name: Anna Abbott MRN: [id number] Date of Birth: December 25, 1950   Medicare Important Message Given:  Yes    Karris Deangelo Stefan [place] 12/20/2015, 4:07 PM

## 2016-04-17 IMAGING — MR MR CERVICAL SPINE W/O CM
5 series · 30 of 48 positions shown · non-contrast
Comparison: None.

CLINICAL DATA: Bilateral arm pain and hand numbness for 3-4 months.

EXAM:
MRI CERVICAL SPINE WITHOUT CONTRAST
TECHNIQUE: Multiplanar, multisequence MR imaging of the cervical spine was
performed. No intravenous contrast was administered.

[Series 3: T2 · sagittal · 3.0mm · 0.66mm/px · 6 of 12 slices shown (1 of 2)]
[im 1/12]
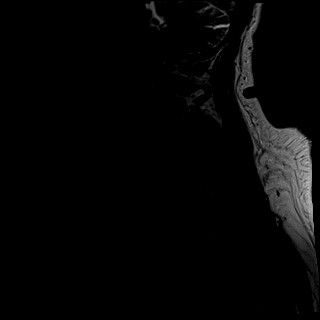
[im 3/12]
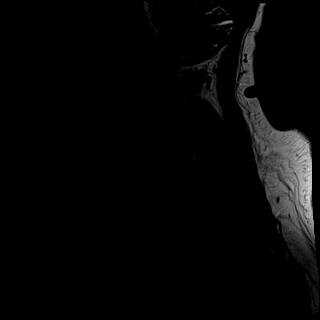
[im 5/12]
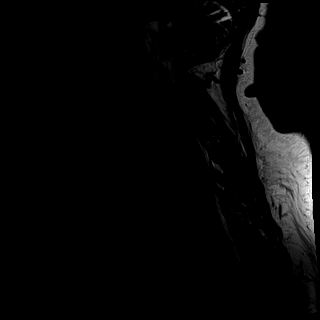
[im 7/12]
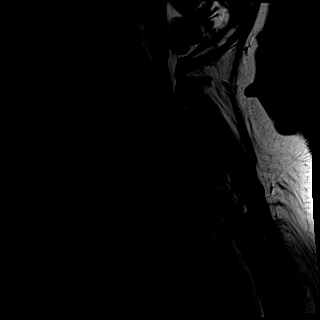
[im 9/12]
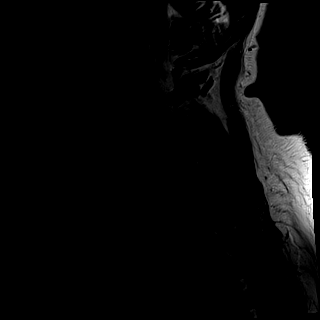
[im 12/12]
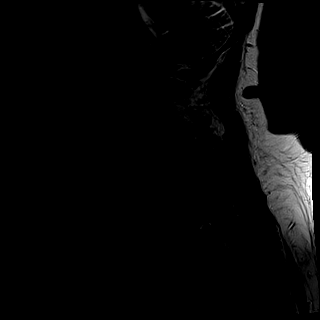

[Series 4: T1 · sagittal · 3.0mm · 0.66mm/px · 7 of 12 slices shown]
[im 1/12]
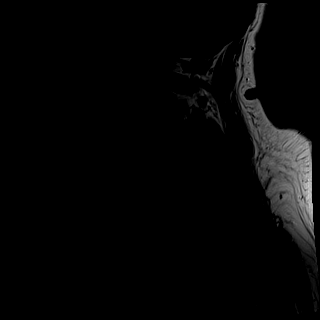
[im 2/12]
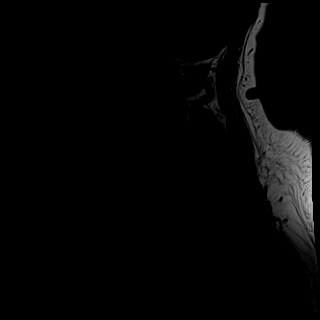
[im 4/12]
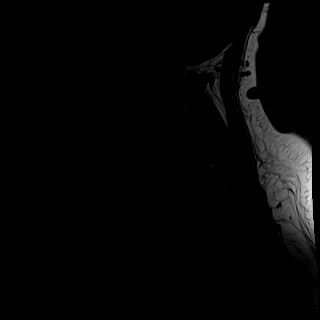
[im 6/12]
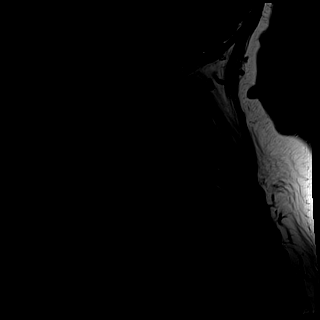
[im 8/12]
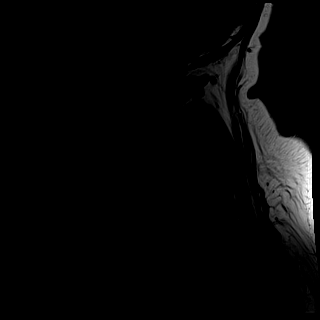
[im 10/12]
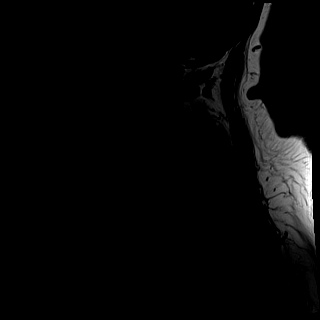
[im 12/12]
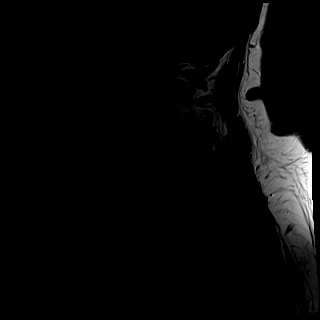

[Series 5: T2 · axial · 3.0mm · 0.56mm/px · z∈[-58,+29]mm · 8 of 25 slices shown (2 of 2)]
[im 1/25]
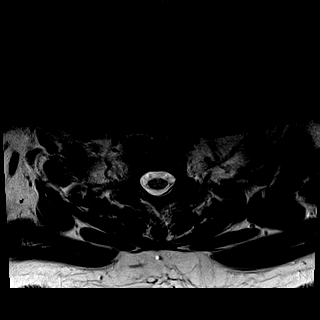
[im 4/25]
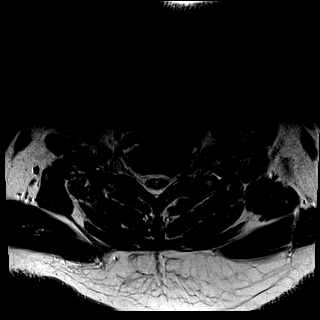
[im 8/25]
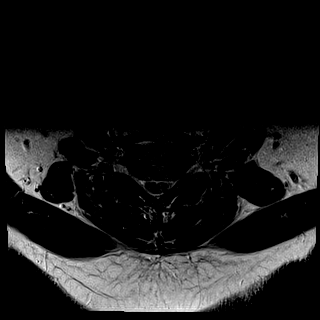
[im 12/25]
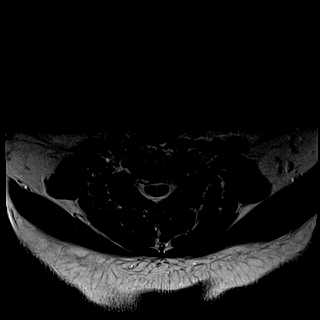
[im 13/25]
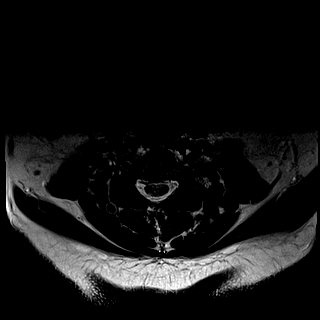
[im 17/25]
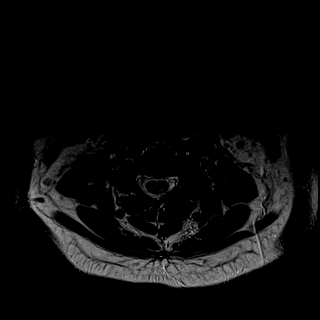
[im 21/25]
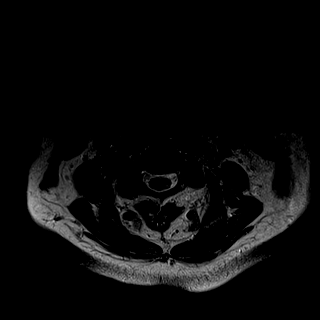
[im 25/25]
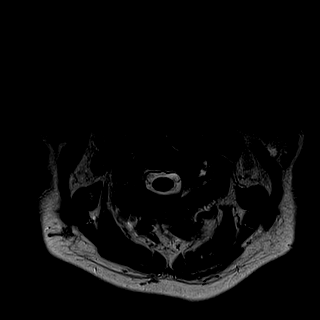

[Series 6: tir sag · sagittal · 3.0mm · 0.41mm/px · 7 of 12 slices shown]
[im 1/12]
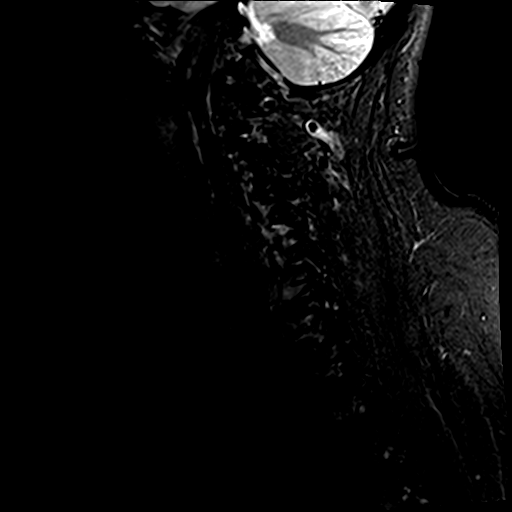
[im 2/12]
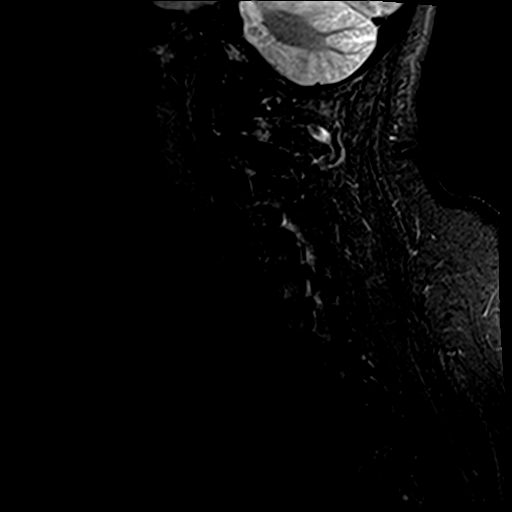
[im 4/12]
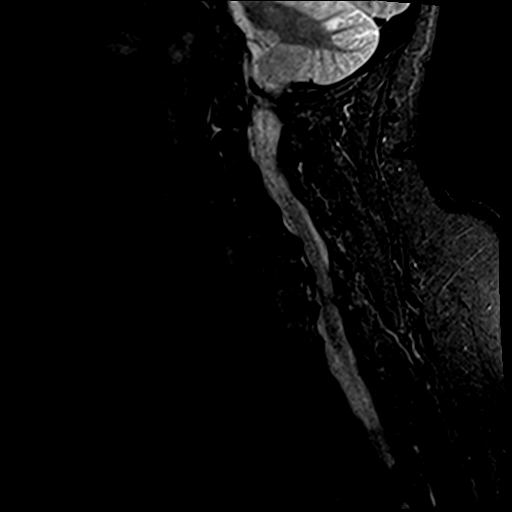
[im 6/12]
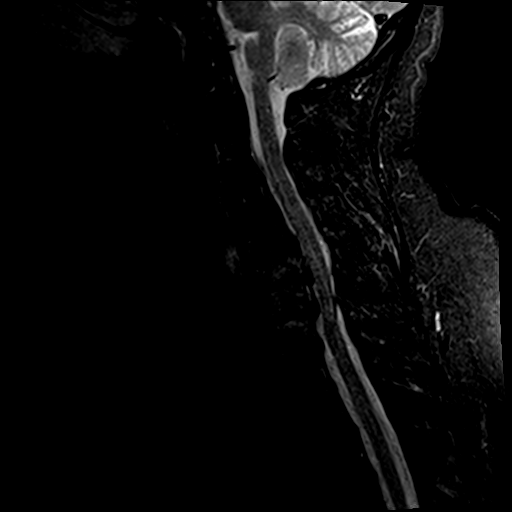
[im 8/12]
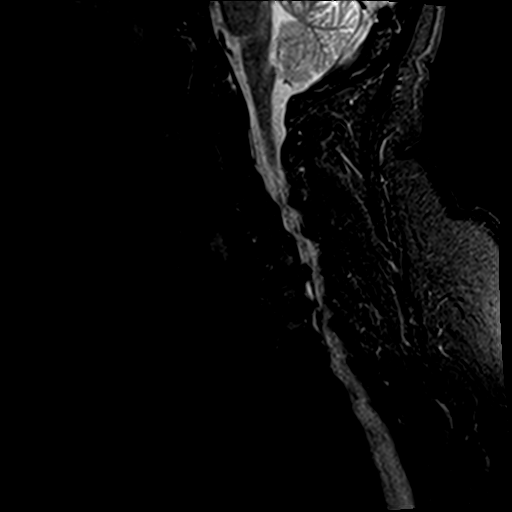
[im 10/12]
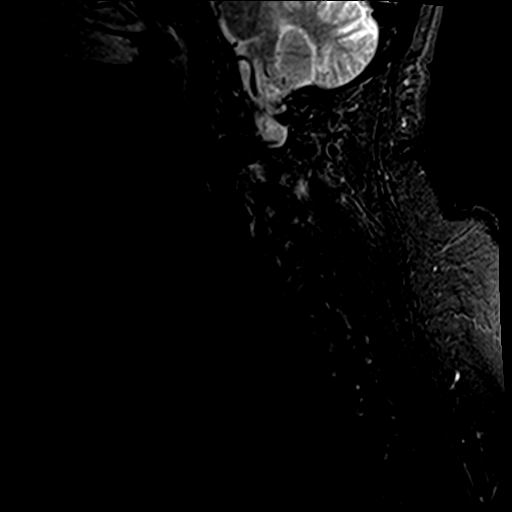
[im 12/12]
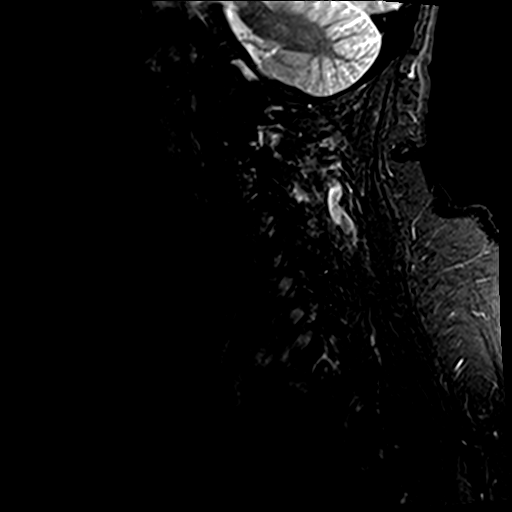

[Series 7: GRE · axial · 3.0mm · 0.35mm/px · z∈[-58,-47]mm · 2 of 25 slices shown]
[im 1/25]
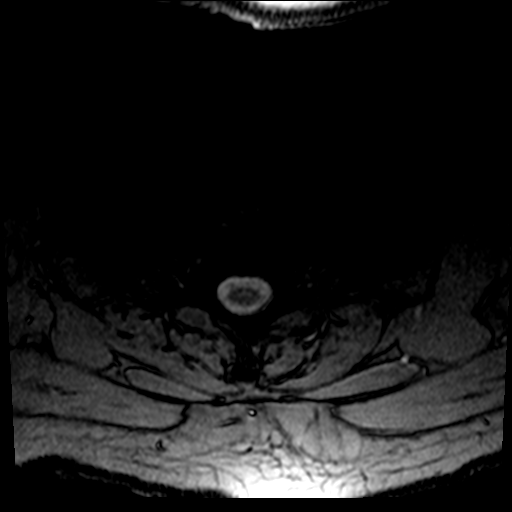
[im 4/25]
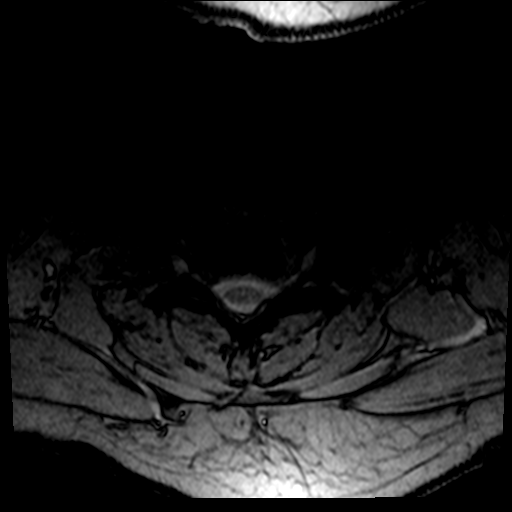

[30 of 48 positions shown; findings below may reference images not displayed]

FINDINGS: Examination is somewhat limited by patient motion.

Advanced degenerative cervical spondylosis with multilevel disc
disease and facet disease. There is reversal of the normal cervical
lordosis and degenerative posterior subluxation of C4, C5 and C6.
There are moderate endplate reactive changes at these levels also.
No worrisome bone lesions. Moderate artifact through the cervical
spinal cord but no obvious cord lesions or syrinx.

C2-3: Mild right foraminal stenosis due to disc osteophyte complex
and significant asymmetric right-sided facet disease. No spinal or
left foraminal stenosis.

C3-4: Mild bulging annulus and moderate facet disease. No
significant spinal stenosis. Mild bilateral foraminal stenosis, left
greater than right.

C4-5: Advanced degenerative disc disease and facet disease. Bulging
annulus, osteophytic ridging and uncinate spurring with flattening
of the ventral thecal sac and significant narrowing of the ventral
CSF space. Moderate right and mild left foraminal stenosis.

C5-6: Diffuse bulging degenerated annulus, osteophytic ridging and
uncinate spurring changes with mass effect on the ventral thecal sac
and significant narrowing of the ventral CSF space. There is
moderate to moderately severe bilateral foraminal stenosis.

C6-7: Diffuse bulging annulus, osteophytic ridging and uncinate
spurring with flattening of the ventral thecal sac and near
obliteration of the ventral CSF space. Moderate to moderately severe
bilateral foraminal stenosis.

C7-T1:  Bulging annulus but no spinal or foraminal stenosis.
IMPRESSION: 1. Advanced degenerative cervical spondylosis with multilevel disc
disease and facet disease. There is multilevel multifactorial spinal
and foraminal stenosis as discussed above at the individual levels.
2. The most significant levels are C5-6 and C6-7.

## 2016-07-13 ENCOUNTER — Other Ambulatory Visit: Payer: Self-pay | Admitting: Physical Medicine and Rehabilitation

## 2016-07-13 DIAGNOSIS — M19019 Primary osteoarthritis, unspecified shoulder: Secondary | ICD-10-CM

## 2016-08-15 ENCOUNTER — Ambulatory Visit
Admission: RE | Admit: 2016-08-15 | Discharge: 2016-08-15 | Disposition: A | Discharge status: Home / Self Care | Payer: Medicare Other | Source: Ambulatory Visit | Attending: Physical Medicine and Rehabilitation | Admitting: Physical Medicine and Rehabilitation

## 2016-08-15 DIAGNOSIS — M19019 Primary osteoarthritis, unspecified shoulder: Secondary | ICD-10-CM

## 2017-01-09 IMAGING — RF DG LUMBAR SPINE 2-3V
1 series · 3 of 3 positions shown · non-contrast
Comparison: Lumbar spine radiographs performed 11/12/2015

CLINICAL DATA: Posterior lumbar interbody fusion at L2-L5.

EXAM:
LUMBAR SPINE - 2-3 VIEW

[Series 1: run · 3 of 3 slices shown]
[im 1/3]
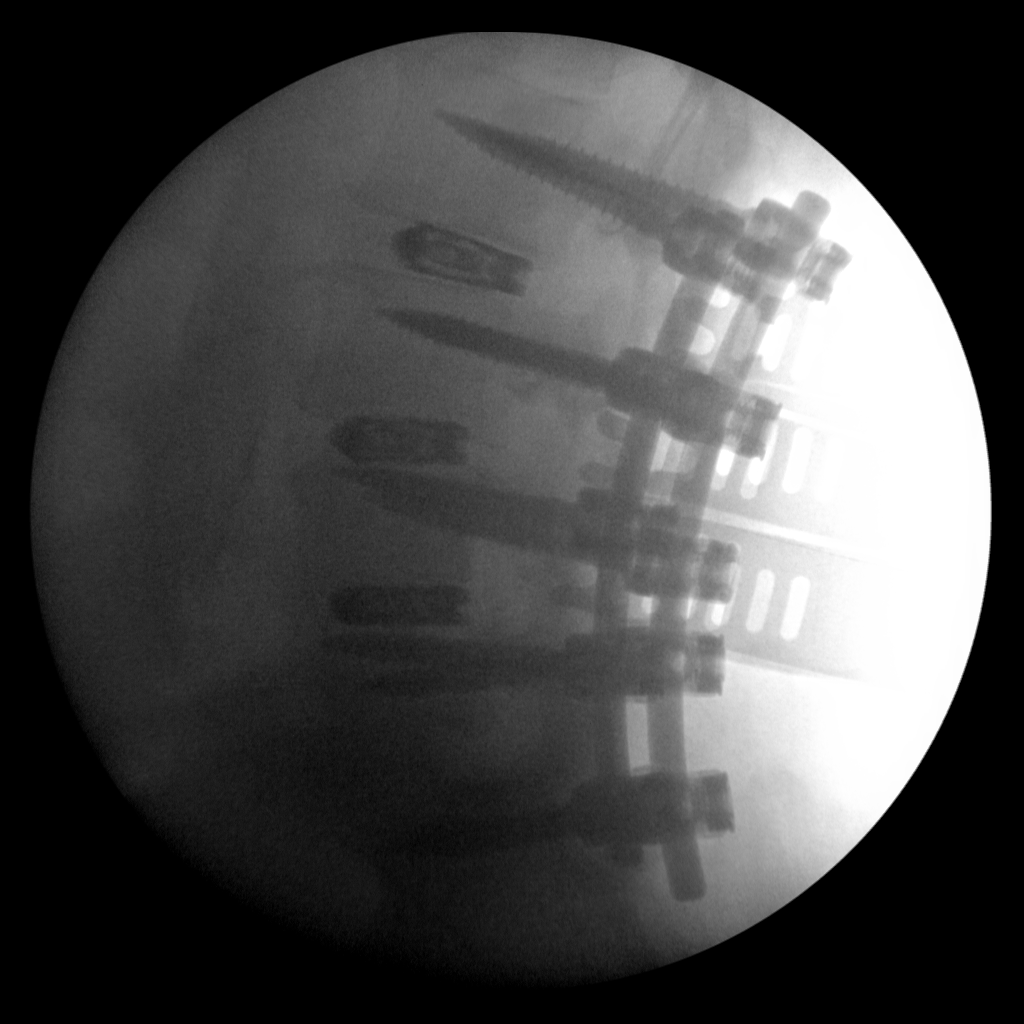
[im 2/3]
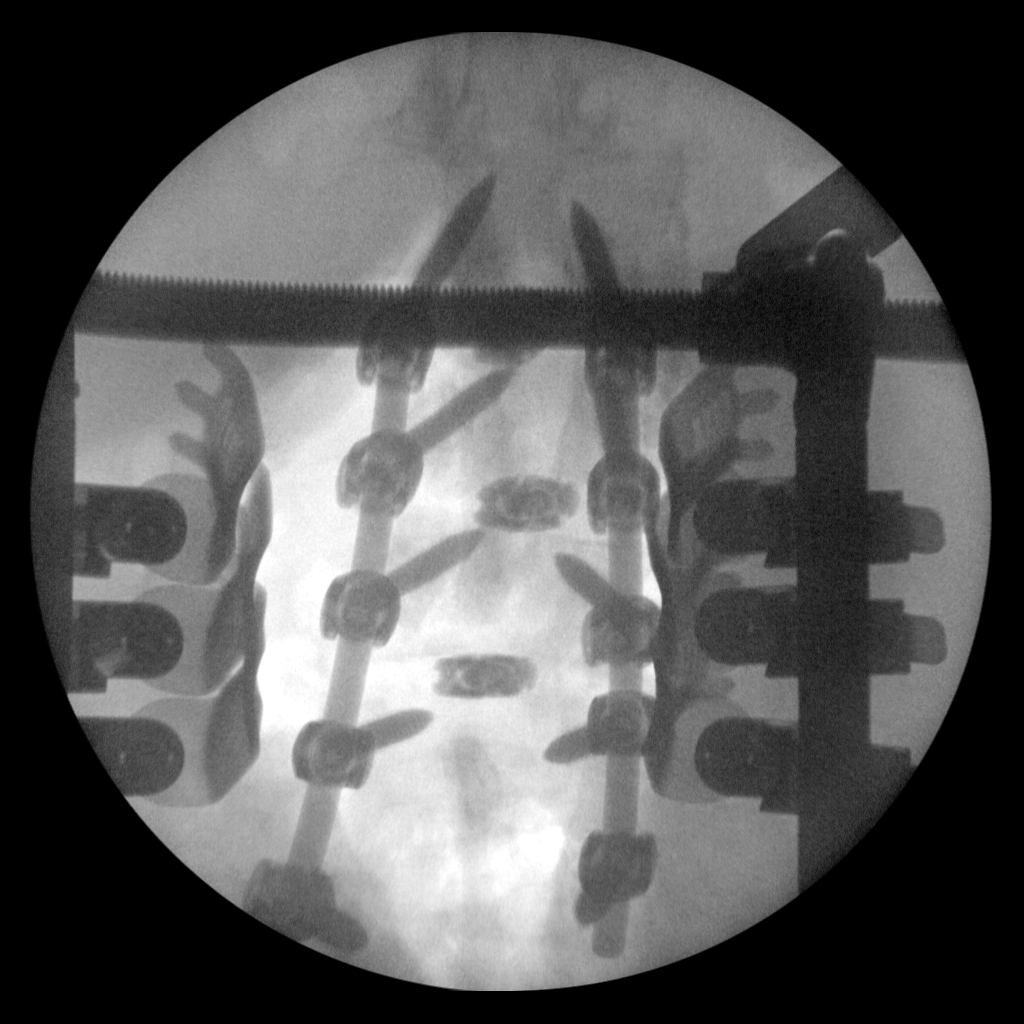
[im 3/3]
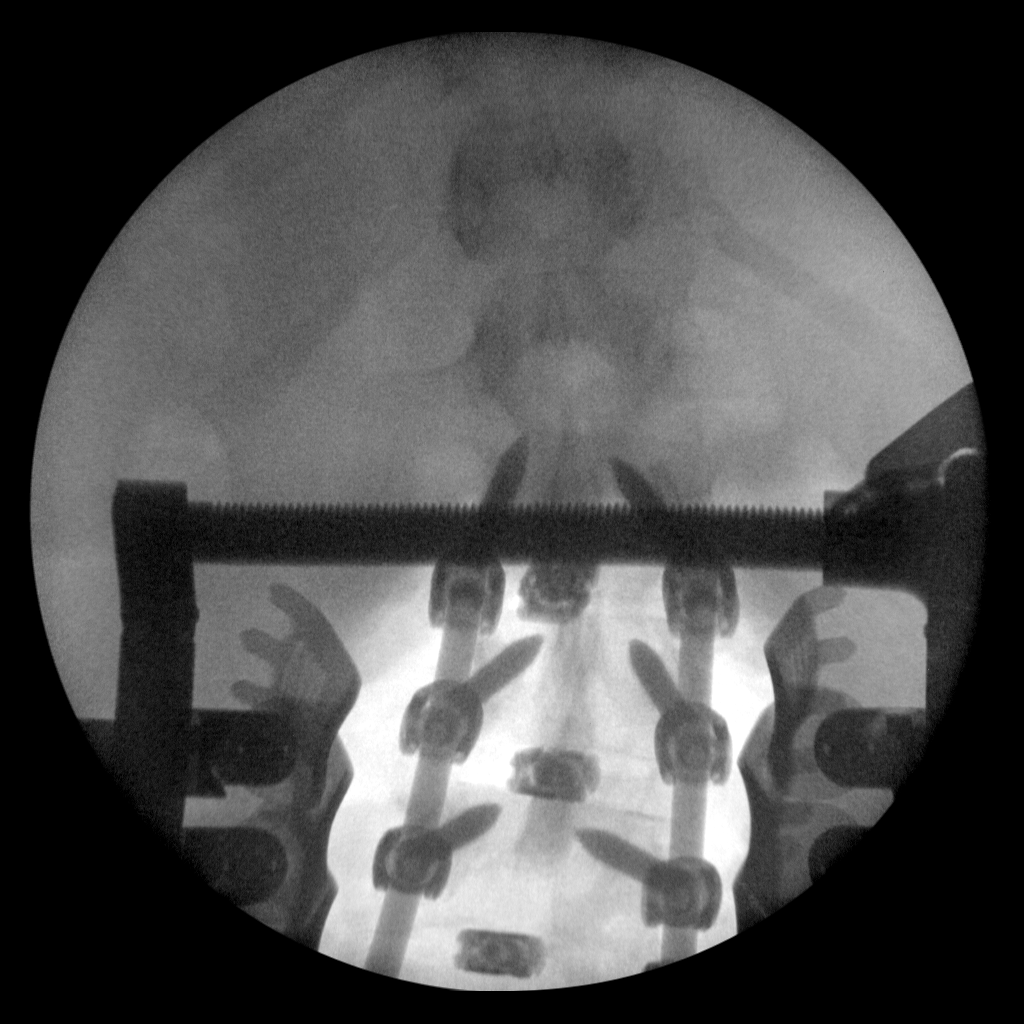

[3 of 3 positions shown; findings below may reference images not displayed]

FINDINGS: Three fluoroscopic C-arm images are provided from the OR,
demonstrating placement of posterior lumbar spinal fusion hardware
across five vertebral bodies, with associated spacers. Visualized
hardware appears intact.
IMPRESSION: Status post posterior lumbar interbody fusion.

## 2020-08-30 DEATH — deceased
